# Patient Record
Sex: Female | Born: 1985
Health system: Southern US, Community
[De-identification: ages and names within clinical notes are randomized; demographics above are authoritative.]

## PROBLEM LIST (undated history)

## (undated) DIAGNOSIS — K603 Anal fistula, unspecified: Secondary | ICD-10-CM

## (undated) DIAGNOSIS — E669 Obesity, unspecified: Secondary | ICD-10-CM

## (undated) HISTORY — DX: Obesity, unspecified: E66.9

## (undated) HISTORY — PX: WISDOM TOOTH EXTRACTION: SHX21

---

## 2013-05-02 NOTE — L&D Delivery Note (Signed)
Delivery Note At 6:26 PM a viable female was delivered via Vaginal, Spontaneous Delivery (Presentation: ; Occiput Anterior).  APGAR: 9, 9; weight  pending.   Placenta status: Intact, Spontaneous.  Cord: 3 vessels with the following complications: None.  Cord pH: not obtained  Anesthesia: Epidural  Episiotomy: Left Mediolateral Lacerations: none Suture Repair: 3.0 chromic Est. Blood Loss (mL): 500  Mom to postpartum.  Baby to Couplet care / Skin to Skin.  Xylon Croom L 02/15/2014, 7:09 PM

## 2013-09-10 LAB — OB RESULTS CONSOLE RUBELLA ANTIBODY, IGM: RUBELLA: IMMUNE

## 2013-09-10 LAB — OB RESULTS CONSOLE HEPATITIS B SURFACE ANTIGEN: Hepatitis B Surface Ag: NEGATIVE

## 2013-09-10 LAB — OB RESULTS CONSOLE GC/CHLAMYDIA
Chlamydia: NEGATIVE
Gonorrhea: NEGATIVE

## 2013-09-10 LAB — OB RESULTS CONSOLE ANTIBODY SCREEN: Antibody Screen: NEGATIVE

## 2013-09-10 LAB — OB RESULTS CONSOLE RPR: RPR: NONREACTIVE

## 2013-09-10 LAB — OB RESULTS CONSOLE HIV ANTIBODY (ROUTINE TESTING): HIV: NONREACTIVE

## 2013-09-10 LAB — OB RESULTS CONSOLE ABO/RH: RH TYPE: NEGATIVE

## 2014-01-16 LAB — OB RESULTS CONSOLE GBS: GBS: POSITIVE

## 2014-02-15 ENCOUNTER — Encounter (HOSPITAL_COMMUNITY): Payer: 59 | Admitting: Anesthesiology

## 2014-02-15 ENCOUNTER — Inpatient Hospital Stay (HOSPITAL_COMMUNITY)
Admission: AD | Admit: 2014-02-15 | Discharge: 2014-02-17 | DRG: 775 | Disposition: A | Discharge status: Home / Self Care | Payer: 59 | Source: Ambulatory Visit | Attending: Obstetrics and Gynecology | Admitting: Obstetrics and Gynecology

## 2014-02-15 ENCOUNTER — Encounter (HOSPITAL_COMMUNITY): Payer: Self-pay

## 2014-02-15 ENCOUNTER — Inpatient Hospital Stay (HOSPITAL_COMMUNITY): Payer: 59 | Admitting: Anesthesiology

## 2014-02-15 DIAGNOSIS — Z3A40 40 weeks gestation of pregnancy: Secondary | ICD-10-CM | POA: Diagnosis present

## 2014-02-15 DIAGNOSIS — O480 Post-term pregnancy: Principal | ICD-10-CM | POA: Diagnosis present

## 2014-02-15 DIAGNOSIS — IMO0001 Reserved for inherently not codable concepts without codable children: Secondary | ICD-10-CM

## 2014-02-15 DIAGNOSIS — O99824 Streptococcus B carrier state complicating childbirth: Secondary | ICD-10-CM | POA: Diagnosis present

## 2014-02-15 LAB — CBC
HCT: 34.2 % — ABNORMAL LOW (ref 36.0–46.0)
HEMOGLOBIN: 12 g/dL (ref 12.0–15.0)
MCH: 30.8 pg (ref 26.0–34.0)
MCHC: 35.1 g/dL (ref 30.0–36.0)
MCV: 87.9 fL (ref 78.0–100.0)
PLATELETS: 136 10*3/uL — AB (ref 150–400)
RBC: 3.89 MIL/uL (ref 3.87–5.11)
RDW: 13.9 % (ref 11.5–15.5)
WBC: 12.2 10*3/uL — AB (ref 4.0–10.5)

## 2014-02-15 LAB — RPR

## 2014-02-15 LAB — URINE MICROSCOPIC-ADD ON

## 2014-02-15 LAB — URINALYSIS, ROUTINE W REFLEX MICROSCOPIC
Bilirubin Urine: NEGATIVE
GLUCOSE, UA: NEGATIVE mg/dL
KETONES UR: NEGATIVE mg/dL
Leukocytes, UA: NEGATIVE
Nitrite: NEGATIVE
PROTEIN: NEGATIVE mg/dL
Specific Gravity, Urine: 1.015 (ref 1.005–1.030)
Urobilinogen, UA: 0.2 mg/dL (ref 0.0–1.0)
pH: 7 (ref 5.0–8.0)

## 2014-02-15 MED ORDER — ONDANSETRON HCL 4 MG/2ML IJ SOLN
4.0000 mg | Freq: Four times a day (QID) | INTRAMUSCULAR | Status: DC | PRN
  Administered 2014-02-15: 4 mg via INTRAVENOUS
  Filled 2014-02-15: qty 2

## 2014-02-15 MED ORDER — ZOLPIDEM TARTRATE 5 MG PO TABS: 5.0000 mg | ORAL_TABLET | Freq: Every evening | ORAL | Status: DC | PRN

## 2014-02-15 MED ORDER — LACTATED RINGERS IV SOLN: 500.0000 mL | Freq: Once | INTRAVENOUS | Status: DC

## 2014-02-15 MED ORDER — OXYCODONE-ACETAMINOPHEN 5-325 MG PO TABS
1.0000 | ORAL_TABLET | ORAL | Status: DC | PRN
Start: 2014-02-15 — End: 2014-02-17

## 2014-02-15 MED ORDER — OXYTOCIN 40 UNITS IN LACTATED RINGERS INFUSION - SIMPLE MED: 62.5000 mL/h | INTRAVENOUS | Status: DC

## 2014-02-15 MED ORDER — LIDOCAINE HCL (PF) 1 % IJ SOLN
INTRAMUSCULAR | Status: AC
  Administered 2014-02-15: 30 mL
  Filled 2014-02-15: qty 30

## 2014-02-15 MED ORDER — LACTATED RINGERS IV SOLN: 500.0000 mL | INTRAVENOUS | Status: DC | PRN

## 2014-02-15 MED ORDER — LIDOCAINE HCL (PF) 1 % IJ SOLN
30.0000 mL | INTRAMUSCULAR | Status: AC | PRN
Start: 2014-02-15 — End: 2014-02-15
  Administered 2014-02-15 (×2): 5 mL via SUBCUTANEOUS

## 2014-02-15 MED ORDER — ACETAMINOPHEN 325 MG PO TABS: 650.0000 mg | ORAL_TABLET | ORAL | Status: DC | PRN

## 2014-02-15 MED ORDER — IBUPROFEN 600 MG PO TABS
600.0000 mg | ORAL_TABLET | Freq: Four times a day (QID) | ORAL | Status: DC
  Administered 2014-02-15 – 2014-02-17 (×7): 600 mg via ORAL
  Filled 2014-02-15 (×7): qty 1

## 2014-02-15 MED ORDER — CITRIC ACID-SODIUM CITRATE 334-500 MG/5ML PO SOLN: 30.0000 mL | ORAL | Status: DC | PRN

## 2014-02-15 MED ORDER — EPHEDRINE 5 MG/ML INJ
10.0000 mg | INTRAVENOUS | Status: DC | PRN
  Filled 2014-02-15: qty 2

## 2014-02-15 MED ORDER — TETANUS-DIPHTH-ACELL PERTUSSIS 5-2.5-18.5 LF-MCG/0.5 IM SUSP: 0.5000 mL | Freq: Once | INTRAMUSCULAR | Status: DC

## 2014-02-15 MED ORDER — SENNOSIDES-DOCUSATE SODIUM 8.6-50 MG PO TABS
2.0000 | ORAL_TABLET | ORAL | Status: DC
  Administered 2014-02-15 – 2014-02-16 (×2): 2 via ORAL
  Filled 2014-02-15 (×2): qty 2

## 2014-02-15 MED ORDER — OXYCODONE-ACETAMINOPHEN 5-325 MG PO TABS: 1.0000 | ORAL_TABLET | ORAL | Status: DC | PRN

## 2014-02-15 MED ORDER — DIPHENHYDRAMINE HCL 50 MG/ML IJ SOLN: 12.5000 mg | INTRAMUSCULAR | Status: DC | PRN

## 2014-02-15 MED ORDER — PHENYLEPHRINE 40 MCG/ML (10ML) SYRINGE FOR IV PUSH (FOR BLOOD PRESSURE SUPPORT)
80.0000 ug | PREFILLED_SYRINGE | INTRAVENOUS | Status: DC | PRN
  Filled 2014-02-15: qty 2

## 2014-02-15 MED ORDER — WITCH HAZEL-GLYCERIN EX PADS: 1.0000 "application " | MEDICATED_PAD | CUTANEOUS | Status: DC | PRN

## 2014-02-15 MED ORDER — TERBUTALINE SULFATE 1 MG/ML IJ SOLN: 0.2500 mg | Freq: Once | INTRAMUSCULAR | Status: DC | PRN

## 2014-02-15 MED ORDER — OXYCODONE-ACETAMINOPHEN 5-325 MG PO TABS: 2.0000 | ORAL_TABLET | ORAL | Status: DC | PRN

## 2014-02-15 MED ORDER — PHENYLEPHRINE 40 MCG/ML (10ML) SYRINGE FOR IV PUSH (FOR BLOOD PRESSURE SUPPORT)
80.0000 ug | PREFILLED_SYRINGE | INTRAVENOUS | Status: DC | PRN
  Filled 2014-02-15: qty 2
  Filled 2014-02-15: qty 10

## 2014-02-15 MED ORDER — ONDANSETRON HCL 4 MG/2ML IJ SOLN: 4.0000 mg | INTRAMUSCULAR | Status: DC | PRN

## 2014-02-15 MED ORDER — PENICILLIN G POTASSIUM 5000000 UNITS IJ SOLR
2.5000 10*6.[IU] | INTRAVENOUS | Status: DC
  Administered 2014-02-15: 2.5 10*6.[IU] via INTRAVENOUS
  Filled 2014-02-15 (×4): qty 2.5

## 2014-02-15 MED ORDER — DIPHENHYDRAMINE HCL 25 MG PO CAPS: 25.0000 mg | ORAL_CAPSULE | Freq: Four times a day (QID) | ORAL | Status: DC | PRN

## 2014-02-15 MED ORDER — SIMETHICONE 80 MG PO CHEW: 80.0000 mg | CHEWABLE_TABLET | ORAL | Status: DC | PRN

## 2014-02-15 MED ORDER — PRENATAL MULTIVITAMIN CH
1.0000 | ORAL_TABLET | Freq: Every day | ORAL | Status: DC
Start: 2014-02-16 — End: 2014-02-17
  Administered 2014-02-16 – 2014-02-17 (×2): 1 via ORAL
  Filled 2014-02-15 (×2): qty 1

## 2014-02-15 MED ORDER — FLEET ENEMA 7-19 GM/118ML RE ENEM: 1.0000 | ENEMA | Freq: Every day | RECTAL | Status: DC | PRN

## 2014-02-15 MED ORDER — LANOLIN HYDROUS EX OINT: TOPICAL_OINTMENT | CUTANEOUS | Status: DC | PRN

## 2014-02-15 MED ORDER — OXYTOCIN BOLUS FROM INFUSION: 500.0000 mL | INTRAVENOUS | Status: DC

## 2014-02-15 MED ORDER — PENICILLIN G POTASSIUM 5000000 UNITS IJ SOLR
5.0000 10*6.[IU] | Freq: Once | INTRAVENOUS | Status: AC
  Administered 2014-02-15: 5 10*6.[IU] via INTRAVENOUS
  Filled 2014-02-15: qty 5

## 2014-02-15 MED ORDER — ONDANSETRON HCL 4 MG PO TABS: 4.0000 mg | ORAL_TABLET | ORAL | Status: DC | PRN

## 2014-02-15 MED ORDER — BISACODYL 10 MG RE SUPP: 10.0000 mg | Freq: Every day | RECTAL | Status: DC | PRN

## 2014-02-15 MED ORDER — FENTANYL 2.5 MCG/ML BUPIVACAINE 1/10 % EPIDURAL INFUSION (WH - ANES)
14.0000 mL/h | INTRAMUSCULAR | Status: DC | PRN
  Administered 2014-02-15: 14 mL/h via EPIDURAL
  Filled 2014-02-15: qty 125

## 2014-02-15 MED ORDER — BENZOCAINE-MENTHOL 20-0.5 % EX AERO
1.0000 "application " | INHALATION_SPRAY | CUTANEOUS | Status: DC | PRN
  Administered 2014-02-17: 1 via TOPICAL
  Filled 2014-02-15 (×2): qty 56

## 2014-02-15 MED ORDER — OXYTOCIN 40 UNITS IN LACTATED RINGERS INFUSION - SIMPLE MED
1.0000 m[IU]/min | INTRAVENOUS | Status: DC
  Administered 2014-02-15: 2 m[IU]/min via INTRAVENOUS
  Filled 2014-02-15: qty 1000

## 2014-02-15 MED ORDER — MEDROXYPROGESTERONE ACETATE 150 MG/ML IM SUSP: 150.0000 mg | INTRAMUSCULAR | Status: DC | PRN

## 2014-02-15 MED ORDER — DIBUCAINE 1 % RE OINT: 1.0000 "application " | TOPICAL_OINTMENT | RECTAL | Status: DC | PRN

## 2014-02-15 MED ORDER — LACTATED RINGERS IV SOLN
INTRAVENOUS | Status: DC
Start: 2014-02-15 — End: 2014-02-15
  Administered 2014-02-15: 13:00:00 via INTRAVENOUS

## 2014-02-15 MED ORDER — MEASLES, MUMPS & RUBELLA VAC ~~LOC~~ INJ
0.5000 mL | INJECTION | Freq: Once | SUBCUTANEOUS | Status: DC
  Filled 2014-02-15: qty 0.5

## 2014-02-15 NOTE — MAU Note (Signed)
Dr Vincente PoliGrewal notified of pt's VE, orders received to ambulate x 1 more hour and recheck cervix

## 2014-02-15 NOTE — Anesthesia Procedure Notes (Signed)
Epidural Patient location during procedure: OB Start time: 02/15/2014 12:27 PM End time: 02/15/2014 12:41 PM  Staffing Anesthesiologist: Theador Jezewski, CHRIS Performed by: anesthesiologist   Preanesthetic Checklist Completed: patient identified, surgical consent, pre-op evaluation, timeout performed, IV checked, risks and benefits discussed and monitors and equipment checked  Epidural Patient position: sitting Prep: site prepped and draped and DuraPrep Patient monitoring: heart rate, cardiac monitor, continuous pulse ox and blood pressure Approach: midline Location: L4-L5 Injection technique: LOR saline  Needle:  Needle type: Tuohy  Needle gauge: 17 G Needle length: 9 cm Needle insertion depth: 7 cm Catheter type: closed end flexible Catheter size: 19 Gauge Catheter at skin depth: 12 cm Test dose: Other and negative  Assessment Events: blood not aspirated, injection not painful, no injection resistance, negative IV test and no paresthesia  Additional Notes H+P and labs checked, risks and benefits discussed with the patient, consent obtained, procedure tolerated well and without complications.  Reason for block:procedure for pain

## 2014-02-15 NOTE — MAU Note (Signed)
Pt states u/c's q4 minutes apart. Denies gush of fluid. Does have bloody show.

## 2014-02-15 NOTE — Anesthesia Preprocedure Evaluation (Signed)
Anesthesia Evaluation  Patient identified by MRN, date of birth, ID band Patient awake    Reviewed: Allergy & Precautions, H&P , NPO status , Patient's Chart, lab work & pertinent test results  History of Anesthesia Complications Negative for: history of anesthetic complications  Airway Mallampati: II TM Distance: >3 FB Neck ROM: Full    Dental  (+) Teeth Intact   Pulmonary neg pulmonary ROS,          Cardiovascular negative cardio ROS  Rhythm:Regular     Neuro/Psych negative neurological ROS  negative psych ROS   GI/Hepatic negative GI ROS,   Endo/Other  negative endocrine ROS  Renal/GU negative Renal ROS     Musculoskeletal   Abdominal   Peds  Hematology  (+) anemia ,   Anesthesia Other Findings   Reproductive/Obstetrics (+) Pregnancy                           Anesthesia Physical Anesthesia Plan  ASA: II  Anesthesia Plan: Epidural   Post-op Pain Management:    Induction:   Airway Management Planned:   Additional Equipment: None  Intra-op Plan:   Post-operative Plan:   Informed Consent: I have reviewed the patients History and Physical, chart, labs and discussed the procedure including the risks, benefits and alternatives for the proposed anesthesia with the patient or authorized representative who has indicated his/her understanding and acceptance.   Dental advisory given  Plan Discussed with: Anesthesiologist  Anesthesia Plan Comments:         Anesthesia Quick Evaluation

## 2014-02-15 NOTE — Progress Notes (Signed)
Dr Vincente PoliGrewal notified of pt's VE orders receive to admit pt

## 2014-02-15 NOTE — H&P (Signed)
Alyssa Cordova is a 28 y.o. G 1 P 0 at 40 w 3 days presents in early labor. Now status post epidural and feeling much better. Maternal Medical History:  Reason for admission: Contractions.   Contractions: Onset was 3-5 hours ago.    Fetal activity: Perceived fetal activity is normal.      OB History   Grav Para Term Preterm Abortions TAB SAB Ect Mult Living   1              Past Medical History  Diagnosis Date  . Medical history non-contributory    Past Surgical History  Procedure Laterality Date  . No past surgeries     Family History: family history includes Asthma in her mother; COPD in her maternal grandmother; Diabetes in her mother; Heart disease in her maternal grandfather and mother; Hypertension in her mother; Kidney disease in her mother. Social History:  reports that she has never smoked. She has never used smokeless tobacco. She reports that she does not drink alcohol or use illicit drugs.   Prenatal Transfer Tool  Maternal Diabetes: No Genetic Screening: Normal Maternal Ultrasounds/Referrals: Normal Fetal Ultrasounds or other Referrals:  None Maternal Substance Abuse:  No Significant Maternal Medications:  None Significant Maternal Lab Results:  None Other Comments:  None  Review of Systems  All other systems reviewed and are negative.   Dilation: 3.5 Effacement (%): 100 Station: -1 Exam by:: dr Vincente Poligrewal Blood pressure 123/80, pulse 70, temperature 97.3 F (36.3 C), temperature source Oral, resp. rate 20, height 5\' 11"  (1.803 m), weight 106.822 kg (235 lb 8 oz), SpO2 100.00%. Maternal Exam:  Uterine Assessment: Contraction strength is moderate.  Contraction frequency is irregular.   Abdomen: Fetal presentation: vertex     Fetal Exam Fetal State Assessment: Category I - tracings are normal.     Physical Exam  Nursing note and vitals reviewed. Constitutional: She appears well-developed and well-nourished.  HENT:  Head: Normocephalic.  Neck:  Normal range of motion.  Cardiovascular: Normal rate and regular rhythm.   Respiratory: Effort normal.  GI: Soft.    Prenatal labs: ABO, Rh: --/--/A NEG (10/17 1115) Antibody: POS (10/17 1115) Rubella: Immune (05/12 0000) RPR: Nonreactive (05/12 0000)  HBsAg: Negative (05/12 0000)  HIV: Non-reactive (05/12 0000)  GBS: Positive (09/17 0000)   Assessment/Plan: IUP at 40 w 3 days Labor GBBS + Status post epidural Antibiotics started for GBBS + Recommend pitocin for labor augmentation - risks reviewed Anticipate NSVD  Alyssa Cordova L 02/15/2014, 1:51 PM

## 2014-02-16 LAB — CBC
HCT: 28.7 % — ABNORMAL LOW (ref 36.0–46.0)
Hemoglobin: 10 g/dL — ABNORMAL LOW (ref 12.0–15.0)
MCH: 30.8 pg (ref 26.0–34.0)
MCHC: 34.8 g/dL (ref 30.0–36.0)
MCV: 88.3 fL (ref 78.0–100.0)
Platelets: 125 10*3/uL — ABNORMAL LOW (ref 150–400)
RBC: 3.25 MIL/uL — AB (ref 3.87–5.11)
RDW: 14 % (ref 11.5–15.5)
WBC: 15.7 10*3/uL — AB (ref 4.0–10.5)

## 2014-02-16 MED ORDER — RHO D IMMUNE GLOBULIN 1500 UNIT/2ML IJ SOSY
300.0000 ug | PREFILLED_SYRINGE | Freq: Once | INTRAMUSCULAR | Status: AC
  Administered 2014-02-16: 300 ug via INTRAVENOUS
  Filled 2014-02-16: qty 2

## 2014-02-16 NOTE — Plan of Care (Signed)
Problem: Phase I Progression Outcomes Goal: Voiding adequately Outcome: Completed/Met Date Met:  02/16/14 Pt is voiding well. Goal: OOB as tolerated unless otherwise ordered Outcome: Completed/Met Date Met:  02/16/14 Pt is up ambulating in room independently.  Problem: Phase II Progression Outcomes Goal: Pain controlled on oral analgesia Pain is well controlled well with motrin.  Goal: Tolerating diet Outcome: Completed/Met Date Met:  02/16/14 Pt is tolerating a regular diet well.

## 2014-02-16 NOTE — Anesthesia Postprocedure Evaluation (Signed)
Anesthesia Post Note  Patient: Alyssa Cordova  Procedure(s) Performed: * No procedures listed *  Anesthesia type: Epidural  Patient location: Mother/Baby  Post pain: Pain level controlled  Post assessment: Post-op Vital signs reviewed  Last Vitals:  Filed Vitals:   02/16/14 0500  BP: 100/61  Pulse: 75  Temp: 36.6 C  Resp: 18    Post vital signs: Reviewed  Level of consciousness:alert  Complications: No apparent anesthesia complications

## 2014-02-16 NOTE — Progress Notes (Signed)
Post Partum Day 1 Subjective: no complaints and up ad lib  Objective: Blood pressure 100/61, pulse 75, temperature 97.8 F (36.6 C), temperature source Oral, resp. rate 18, height 5\' 11"  (1.803 m), weight 106.822 kg (235 lb 8 oz), SpO2 100.00%, unknown if currently breastfeeding.  Physical Exam:  General: alert, cooperative and appears stated age Lochia: appropriate Uterine Fundus: firm Incision: healing well, no significant drainage, no dehiscence DVT Evaluation: No evidence of DVT seen on physical exam.   Recent Labs  02/15/14 1115 02/16/14 0550  HGB 12.0 10.0*  HCT 34.2* 28.7*    Assessment/Plan: Plan for discharge tomorrow   LOS: 1 day   Pedro Oldenburg L 02/16/2014, 8:00 AM

## 2014-02-16 NOTE — Lactation Note (Signed)
This note was copied from the chart of Alyssa Jannifer RodneyChristy Fang. Lactation Consultation Note  Patient Name: Alyssa Cordova [id number]'BToday's Date: 02/16/2014 Reason for consult: Initial assessment Mom reports some mild nipple tenderness with initial latch. No breakdown noted. LC assisted Mom with positioning and obtaining depth with latch. Mom reports less discomfort. BF basics reviewed, cluster feeding discussed. Lactation brochure left for review, advised of OP services and support group. Answered Mom's questions regarding purchase of pump. Advised to apply EBM to sore nipples. Encouraged to call for questions/concerns or help with latch if tenderness continues.   Maternal Data Has patient been taught Hand Expression?: Yes Does the patient have breastfeeding experience prior to this delivery?: No  Feeding Feeding Type: Breast Fed Length of feed: 25 min  LATCH Score/Interventions Latch: Grasps breast easily, tongue down, lips flanged, rhythmical sucking. Intervention(s): Adjust position;Assist with latch;Breast massage;Breast compression  Audible Swallowing: A few with stimulation  Type of Nipple: Everted at rest and after stimulation  Comfort (Breast/Nipple): Filling, red/small blisters or bruises, mild/mod discomfort  Problem noted: Mild/Moderate discomfort Interventions (Mild/moderate discomfort): Hand massage;Hand expression  Hold (Positioning): Assistance needed to correctly position infant at breast and maintain latch. Intervention(s): Breastfeeding basics reviewed;Support Pillows;Position options;Skin to skin  LATCH Score: 7  Lactation Tools Discussed/Used     Consult Status Consult Status: Follow-up Date: 02/17/14 Follow-up type: In-patient    Alfred LevinsGranger, Mattisen Pohlmann Ann 02/16/2014, 9:10 PM

## 2014-02-17 LAB — RH IG WORKUP (INCLUDES ABO/RH)
ABO/RH(D): A NEG
FETAL SCREEN: NEGATIVE
Gestational Age(Wks): 40.3
Unit division: 0

## 2014-02-17 LAB — CBC
HEMATOCRIT: 28.9 % — AB (ref 36.0–46.0)
Hemoglobin: 9.8 g/dL — ABNORMAL LOW (ref 12.0–15.0)
MCH: 30.3 pg (ref 26.0–34.0)
MCHC: 33.9 g/dL (ref 30.0–36.0)
MCV: 89.5 fL (ref 78.0–100.0)
Platelets: 121 10*3/uL — ABNORMAL LOW (ref 150–400)
RBC: 3.23 MIL/uL — AB (ref 3.87–5.11)
RDW: 14.4 % (ref 11.5–15.5)
WBC: 14.5 10*3/uL — ABNORMAL HIGH (ref 4.0–10.5)

## 2014-02-17 MED ORDER — IBUPROFEN 600 MG PO TABS: 600.0000 mg | ORAL_TABLET | Freq: Four times a day (QID) | ORAL | Status: DC

## 2014-02-17 NOTE — Lactation Note (Addendum)
This note was copied from the chart of Alyssa Alyssa Cordova. Lactation Consultation Note  Patient Name: Alyssa Cordova ZOXWR'UToday's Date: 02/17/2014 Reason for consult: Follow-up assessment Per mom breast feeding is going well , baby cluster fed all night and I'm exhausted. LC reviewed basics for latching , sore nipple and engorgement prevention and tx. Per mom plans [facility] for the healthy [facility] program. Per mom nipples are both tender , using EBM and comfort gels. LC assessed breast tissue with moms permission. LC noted both nipples to be pinky red , right noted a crack at the base of the nipples around 11 -12 o'clock. On the left reddened at the base . Reviewed and had mom re demo hand expressing and steady flow of colostrum noted. Baby woke up , hungry , worked with mom and showed dad how he could help with depth at the breast. Baby fed for 15 mins  With multiply swallows, increased with breast compressions. Per mom comfortable. When baby released nipple appeared normal. Baby asleep. Sore nipple tx stressed. Mother informed of post-discharge support and given phone number to the lactation department,  including services for phone call assistance; out-patient appointments; and breastfeeding support group. List of other breastfeeding  resources in the community given in the handout. Encouraged mother to call for problems or concerns related to breastfeeding.      Maternal Data Has patient been taught Hand Expression?: Yes  Feeding Feeding Type: Breast Fed Length of feed: 15 min  LATCH Score/Interventions Latch: Grasps breast easily, tongue down, lips flanged, rhythmical sucking. Intervention(s): Adjust position;Assist with latch;Breast massage;Breast compression  Audible Swallowing: Spontaneous and intermittent Intervention(s): Skin to skin;Hand expression  Type of Nipple: Everted at rest and after stimulation  Comfort (Breast/Nipple): Filling,  red/small blisters or bruises, mild/mod discomfort  Problem noted: Filling Interventions (Mild/moderate discomfort): Comfort gels (encouraged expression of own colostrum)  Hold (Positioning): Assistance needed to correctly position infant at breast and maintain latch. (worked on depth ) Intervention(s): Breastfeeding basics reviewed;Support Pillows;Position options;Skin to skin  LATCH Score: 8  Lactation Tools Discussed/Used WIC Program: No Pump Review: Setup, frequency, and cleaning;Milk Storage Initiated by:: MAI  Date initiated:: 02/17/14   Consult Status Consult Status: Complete Date: 02/17/14 Follow-up type: In-patient    Kathrin Greathouseorio, Simi Briel Ann 02/17/2014, 10:50 AM

## 2014-02-17 NOTE — Discharge Summary (Signed)
Obstetric Discharge Summary Reason for Admission: onset of labor Prenatal Procedures: ultrasound Intrapartum Procedures: spontaneous vaginal delivery Postpartum Procedures: none Complications-Operative and Postpartum: LML episiotomy Hemoglobin  Date Value Ref Range Status  02/17/2014 9.8* 12.0 - 15.0 g/dL Final     HCT  Date Value Ref Range Status  02/17/2014 28.9* 36.0 - 46.0 % Final    Physical Exam:  General: alert and cooperative Lochia: appropriate Uterine Fundus: firm Incision: healing well DVT Evaluation: No evidence of DVT seen on physical exam. Negative Homan's sign. No cords or calf tenderness. No significant calf/ankle edema.  Discharge Diagnoses: Term Pregnancy-delivered  Discharge Information: Date: 02/17/2014 Activity: pelvic rest Diet: routine Medications: PNV and Ibuprofen Condition: stable Instructions: refer to practice specific booklet Discharge to: home   Newborn Data: Live born female  Birth Weight: 7 lb 9.7 oz (3450 g) APGAR: 9, 9  Home with mother.  Alyssa Cordova G 02/17/2014, 8:25 AM

## 2014-02-19 LAB — TYPE AND SCREEN
ABO/RH(D): A NEG
ANTIBODY SCREEN: POSITIVE
DAT, IgG: NEGATIVE
UNIT DIVISION: 0
Unit division: 0

## 2014-03-03 ENCOUNTER — Encounter (HOSPITAL_COMMUNITY): Payer: Self-pay

## 2014-11-11 ENCOUNTER — Other Ambulatory Visit: Payer: 59

## 2014-11-11 ENCOUNTER — Other Ambulatory Visit: Payer: Self-pay | Admitting: Physician Assistant

## 2014-11-11 ENCOUNTER — Other Ambulatory Visit: Payer: Self-pay | Admitting: Family

## 2014-11-11 DIAGNOSIS — R21 Rash and other nonspecific skin eruption: Secondary | ICD-10-CM

## 2014-11-11 DIAGNOSIS — L301 Dyshidrosis [pompholyx]: Secondary | ICD-10-CM

## 2014-11-11 MED ORDER — CLOBETASOL PROPIONATE 0.05 % EX OINT: 1.0000 "application " | TOPICAL_OINTMENT | Freq: Two times a day (BID) | CUTANEOUS | Status: DC

## 2014-11-17 LAB — ANAEROBIC AND AEROBIC CULTURE

## 2014-11-19 ENCOUNTER — Other Ambulatory Visit: Payer: Self-pay | Admitting: Physician Assistant

## 2014-11-20 ENCOUNTER — Other Ambulatory Visit: Payer: Self-pay | Admitting: Physician Assistant

## 2014-11-20 DIAGNOSIS — B958 Unspecified staphylococcus as the cause of diseases classified elsewhere: Secondary | ICD-10-CM

## 2014-11-20 DIAGNOSIS — L089 Local infection of the skin and subcutaneous tissue, unspecified: Principal | ICD-10-CM

## 2014-11-20 MED ORDER — MUPIROCIN 2 % EX OINT: TOPICAL_OINTMENT | CUTANEOUS | Status: DC

## 2014-12-04 ENCOUNTER — Other Ambulatory Visit: Payer: Self-pay | Admitting: Nurse Practitioner

## 2014-12-04 MED ORDER — CEPHALEXIN 500 MG PO CAPS: 500.0000 mg | ORAL_CAPSULE | Freq: Three times a day (TID) | ORAL | Status: DC

## 2014-12-16 ENCOUNTER — Telehealth: Payer: 59 | Admitting: Nurse Practitioner

## 2014-12-16 DIAGNOSIS — N760 Acute vaginitis: Secondary | ICD-10-CM

## 2014-12-16 MED ORDER — FLUCONAZOLE 150 MG PO TABS: ORAL_TABLET | ORAL | Status: DC

## 2014-12-16 NOTE — Progress Notes (Signed)
We are sorry that you are not feeling well. Here is how we plan to help! Based on what you shared with me it looks like you: May have a yeast vaginosis  Vaginosis is an inflammation of the vagina that can result in discharge, itching and pain. The cause is usually a change in the normal balance of vaginal bacteria or an infection. Vaginosis can also result from reduced estrogen levels after menopause.  The most common causes of vaginosis are:   Bacterial vaginosis which results from an overgrowth of one on several organisms that are normally present in your vagina.   Yeast infections which are caused by a naturally occurring fungus called candida.   Vaginal atrophy (atrophic vaginosis) which results from the thinning of the vagina from reduced estrogen levels after menopause.   Trichomoniasis which is caused by a parasite and is commonly transmitted by sexual intercourse.  Factors that increase your risk of developing vaginosis include: . Medications, such as antibiotics and steroids . Uncontrolled diabetes . Use of hygiene products such as bubble bath, vaginal spray or vaginal deodorant . Douching . Wearing damp or tight-fitting clothing . Using an intrauterine device (IUD) for birth control . Hormonal changes, such as those associated with pregnancy, birth control pills or menopause . Sexual activity . Having a sexually transmitted infection  Your treatment plan is A single Diflucan (fluconazole) 150mg tablet once.  I have electronically sent this prescription into the pharmacy that you have chosen.  Be sure to take all of the medication as directed. Stop taking any medication if you develop a rash, tongue swelling or shortness of breath. Mothers who are breast feeding should consider pumping and discarding their breast milk while on these antibiotics. However, there is no consensus that infant exposure at these doses would be harmful.  Remember that medication creams can weaken latex  condoms. .   HOME CARE:  Good hygiene may prevent some types of vaginosis from recurring and may relieve some symptoms:  . Avoid baths, hot tubs and whirlpool spas. Rinse soap from your outer genital area after a shower, and dry the area well to prevent irritation. Don't use scented or harsh soaps, such as those with deodorant or antibacterial action. . Avoid irritants. These include scented tampons and pads. . Wipe from front to back after using the toilet. Doing so avoids spreading fecal bacteria to your vagina.  Other things that may help prevent vaginosis include:  . Don't douche. Your vagina doesn't require cleansing other than normal bathing. Repetitive douching disrupts the normal organisms that reside in the vagina and can actually increase your risk of vaginal infection. Douching won't clear up a vaginal infection. . Use a latex condom. Both female and female latex condoms may help you avoid infections spread by sexual contact. . Wear cotton underwear. Also wear pantyhose with a cotton crotch. If you feel comfortable without it, skip wearing underwear to bed. Yeast thrives in moist environments Your symptoms should improve in the next day or two.  GET HELP RIGHT AWAY IF:  . You have pain in your lower abdomen ( pelvic area or over your ovaries) . You develop nausea or vomiting . You develop a fever . Your discharge changes or worsens . You have persistent pain with intercourse . You develop shortness of breath, a rapid pulse, or you faint.  These symptoms could be signs of problems or infections that need to be evaluated by a medical provider now.  MAKE SURE YOU      Understand these instructions.  Will watch your condition.  Will get help right away if you are not doing well or get worse.   Your e-visit answers were reviewed by a board certified advanced clinical practitioner to complete your personal care plan. Depending upon the condition, your plan could have included  both over the counter or prescription medications. Please review your pharmacy choice to make sure that you have choses a pharmacy that is open for you to pick up any needed prescription, Your safety is important to us. If you have drug allergies check your prescription carefully.  You can use MyChart to ask questions about today's visit, request a non-urgent call back, or ask for a work or school excuse. You will get a MyChart message within the next two days asking about your experience. I hope that your e-visit has been valuable and will speed your recovery.  

## 2014-12-18 ENCOUNTER — Other Ambulatory Visit: Payer: Self-pay | Admitting: Nurse Practitioner

## 2014-12-18 MED ORDER — SULFAMETHOXAZOLE-TRIMETHOPRIM 800-160 MG PO TABS: 1.0000 | ORAL_TABLET | Freq: Two times a day (BID) | ORAL | Status: DC

## 2015-01-18 ENCOUNTER — Other Ambulatory Visit: Payer: Self-pay | Admitting: Physician Assistant

## 2015-01-18 DIAGNOSIS — B958 Unspecified staphylococcus as the cause of diseases classified elsewhere: Secondary | ICD-10-CM

## 2015-01-18 DIAGNOSIS — L089 Local infection of the skin and subcutaneous tissue, unspecified: Principal | ICD-10-CM

## 2015-01-18 MED ORDER — SULFAMETHOXAZOLE-TRIMETHOPRIM 800-160 MG PO TABS: 1.0000 | ORAL_TABLET | Freq: Two times a day (BID) | ORAL | Status: DC

## 2015-01-18 MED ORDER — MUPIROCIN 2 % EX OINT: TOPICAL_OINTMENT | CUTANEOUS | Status: DC

## 2015-04-14 ENCOUNTER — Other Ambulatory Visit: Payer: Self-pay | Admitting: Nurse Practitioner

## 2015-04-14 MED ORDER — AMOXICILLIN 875 MG PO TABS: 875.0000 mg | ORAL_TABLET | Freq: Two times a day (BID) | ORAL | Status: DC

## 2015-05-08 ENCOUNTER — Other Ambulatory Visit: Payer: Self-pay | Admitting: Family

## 2015-05-08 ENCOUNTER — Other Ambulatory Visit: Payer: Self-pay | Admitting: Nurse Practitioner

## 2015-05-08 MED ORDER — NORETHINDRONE 0.35 MG PO TABS: 1.0000 | ORAL_TABLET | Freq: Every day | ORAL | Status: DC

## 2015-05-22 DIAGNOSIS — K603 Anal fistula: Secondary | ICD-10-CM | POA: Diagnosis not present

## 2015-05-25 DIAGNOSIS — R05 Cough: Secondary | ICD-10-CM | POA: Diagnosis not present

## 2015-07-08 ENCOUNTER — Ambulatory Visit: Payer: Self-pay | Admitting: General Surgery

## 2015-07-08 DIAGNOSIS — Z6829 Body mass index (BMI) 29.0-29.9, adult: Secondary | ICD-10-CM | POA: Diagnosis not present

## 2015-07-08 DIAGNOSIS — Z01419 Encounter for gynecological examination (general) (routine) without abnormal findings: Secondary | ICD-10-CM | POA: Diagnosis not present

## 2015-07-08 DIAGNOSIS — K603 Anal fistula: Secondary | ICD-10-CM | POA: Diagnosis not present

## 2015-07-08 NOTE — H&P (Signed)
Alyssa Cordova 07/08/2015 11:52 AM Location: [facility] Patient #: [id number] DOB: 1985-08-01 Married / Language: [place] / Race: White Female  History of Present Illness Alyssa Cordova(Romey Cohea J. Mertice Uffelman MD; 07/08/2015 12:18 PM) Patient words: fistula.  The patient is a 30 year old female.   Note:She is referred by Alyssa ClusterElizabeth Cordova, [facility] for consultation regarding a perianal fistula. 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 In August, she noticed a painful lump in the perianal area. She incised this sharply and had some purulent drainage. Since that time, she had been on 5 different courses of antibiotics. She continues to have persistent drainage from the area. There is a concern for an anal fistula and she's been sent here for further evaluation. No incontinence. She had a vaginal delivery a year and half ago and had an episiotomy with that.  Other Problems Alyssa Cordova(Alyssa Cordova, CMA; 07/08/2015 11:52 AM) No pertinent past medical history  Past Surgical History Alyssa Cordova([place]; 07/08/2015 11:52 AM) Oral Surgery  Diagnostic Studies History Alyssa Cordova([place]; 07/08/2015 11:52 AM) Colonoscopy never Mammogram never Pap Smear 1-5 years ago  Allergies Alyssa Cordova(Alyssa Cordova, CMA; 07/08/2015 11:53 AM) No Known Drug Allergies 07/08/2015  Medication History Alyssa Cordova(Alyssa Cordova, CMA; 07/08/2015 11:53 AM) Gaylord Shihrtho Micronor (0.35MG  Tablet, Oral daily) Active. Medications Reconciled  Social History Alyssa Cordova(Alyssa Cordova, CMA; 07/08/2015 11:52 AM) Caffeine use Carbonated beverages, Coffee, Tea. No alcohol use No drug use Tobacco use Never smoker.  Family History Alyssa Cordova([place]; 07/08/2015 11:52 AM) Diabetes Mellitus Mother. Heart Disease Mother. Kidney Disease Mother. Respiratory Condition Mother.  Pregnancy / Birth History Alyssa Cordova(Alyssa Cordova, CMA; 07/08/2015 11:52 AM) Age at menarche 12 years. Contraceptive History Oral contraceptives. Gravida 1 Maternal age 29-30 Para 1 Regular  periods     Review of Systems Alyssa Cordova(Alyssa Cordova CMA; 07/08/2015 11:52 AM) General Not Present- Appetite Loss, Chills, Fatigue, Fever, Night Sweats, Weight Gain and Weight Loss. Skin Present- New Lesions. Not Present- Change in Wart/Mole, Dryness, Hives, Jaundice, Non-Healing Wounds, Rash and Ulcer. HEENT Not Present- Earache, Hearing Loss, Hoarseness, Nose Bleed, Oral Ulcers, Ringing in the Ears, Seasonal Allergies, Sinus Pain, Sore Throat, Visual Disturbances, Wears glasses/contact lenses and Yellow Eyes. Respiratory Not Present- Bloody sputum, Chronic Cough, Difficulty Breathing, Snoring and Wheezing. Breast Not Present- Breast Mass, Breast Pain, Nipple Discharge and Skin Changes. Cardiovascular Not Present- Chest Pain, Difficulty Breathing Lying Down, Leg Cramps, Palpitations, Rapid Heart Rate, Shortness of Breath and Swelling of Extremities. Gastrointestinal Not Present- Abdominal Pain, Bloating, Bloody Stool, Change in Bowel Habits, Chronic diarrhea, Constipation, Difficulty Swallowing, Excessive gas, Gets full quickly at meals, Hemorrhoids, Indigestion, Nausea, Rectal Pain and Vomiting. Female Genitourinary Not Present- Frequency, Nocturia, Painful Urination, Pelvic Pain and Urgency. Musculoskeletal Not Present- Back Pain, Joint Pain, Joint Stiffness, Muscle Pain, Muscle Weakness and Swelling of Extremities. Neurological Not Present- Decreased Memory, Fainting, Headaches, Numbness, Seizures, Tingling, Tremor, Trouble walking and Weakness. Psychiatric Not Present- Anxiety, Bipolar, Change in Sleep Pattern, Depression, Fearful and Frequent crying. Endocrine Not Present- Cold Intolerance, Excessive Hunger, Hair Changes, Heat Intolerance, Hot flashes and New Diabetes. Hematology Not Present- Easy Bruising, Excessive bleeding, Gland problems, HIV and Persistent Infections.  Vitals Alyssa Cordova(Alyssa Cordova CMA; 07/08/2015 11:54 AM) 07/08/2015 11:53 AM Weight: 206.4 lb Height: 71in Body Surface Area: 2.14  m Body Mass Index: 28.79 kg/m  Temp.: 98.70F(Oral)  Pulse: 84 (Regular)  BP: 110/80 (Sitting, Left Arm, Standard)      Physical Exam Alyssa Cordova(Alyssa Bumbaugh J. Sharena Dibenedetto MD; 07/08/2015 12:22 PM)  The physical exam findings are as follows: Note:General: Slightly overweight female  in NAD. Pleasant and cooperative.  CV: RRR, no murmur, no JVD.  CHEST: Breath sounds equal and clear. Respirations nonlabored.  ANORECTAL: Punctate draining wound at 2:00 position of perianal area. On DRE, a tender irregular area is felt in the anterior anal cannula. A small lacrimal duct probe was used to cannulate the draining wound and this was able to track to an area where I could feel the tip of the probe in the anal canal on digital rectal exam consistent with fistula.  NEUROLOGIC: Alert and oriented, answers questions appropriately.  PSYCHIATRIC: Normal mood, affect , and behavior.    Assessment & Plan Alyssa Pollack MD; 07/08/2015 12:15 PM)  ANAL FISTULA (K60.3) Impression: This is tracking from the approximate 2 o'clock position to the anterior anal area.  Plan: I recommended EUA and repair of anal fistula by way of fistulotomy as long as her is not significant disruption of the anal sphincter muscle doing this. If so, then I would place a seton and come back at a later time to do the fistula. We discussed the procedure, rationale, and risks. Risks include but are not limited to bleeding, recurrent infection or fistula, incontinence, anesthesia, poor wound healing. She seems to understand this and agrees with the plan.  Avel Peace, MD

## 2015-07-27 LAB — HM PAP SMEAR

## 2015-08-20 ENCOUNTER — Encounter (HOSPITAL_BASED_OUTPATIENT_CLINIC_OR_DEPARTMENT_OTHER): Payer: Self-pay | Admitting: *Deleted

## 2015-08-20 NOTE — Progress Notes (Signed)
NPO AFTER MN.  ARRIVE AT 0600.  NEEDS URINE PREG.  [facility] [place] [facility] PT IS FNP.

## 2015-08-24 ENCOUNTER — Other Ambulatory Visit: Payer: Self-pay | Admitting: Family

## 2015-08-24 ENCOUNTER — Other Ambulatory Visit: Payer: 59

## 2015-08-24 DIAGNOSIS — Z01818 Encounter for other preprocedural examination: Secondary | ICD-10-CM | POA: Diagnosis not present

## 2015-08-25 LAB — CBC WITH DIFFERENTIAL/PLATELET
BASOS: 0 %
Basophils Absolute: 0 10*3/uL (ref 0.0–0.2)
EOS (ABSOLUTE): 0.3 10*3/uL (ref 0.0–0.4)
EOS: 2 %
HEMATOCRIT: 41.8 % (ref 34.0–46.6)
Hemoglobin: 13.7 g/dL (ref 11.1–15.9)
IMMATURE GRANS (ABS): 0 10*3/uL (ref 0.0–0.1)
IMMATURE GRANULOCYTES: 0 %
LYMPHS: 21 %
Lymphocytes Absolute: 2.2 10*3/uL (ref 0.7–3.1)
MCH: 28.8 pg (ref 26.6–33.0)
MCHC: 32.8 g/dL (ref 31.5–35.7)
MCV: 88 fL (ref 79–97)
Monocytes Absolute: 0.6 10*3/uL (ref 0.1–0.9)
Monocytes: 6 %
NEUTROS PCT: 71 %
Neutrophils Absolute: 7.2 10*3/uL — ABNORMAL HIGH (ref 1.4–7.0)
PLATELETS: 273 10*3/uL (ref 150–379)
RBC: 4.76 x10E6/uL (ref 3.77–5.28)
RDW: 13.6 % (ref 12.3–15.4)
WBC: 10.4 10*3/uL (ref 3.4–10.8)

## 2015-08-26 NOTE — Anesthesia Preprocedure Evaluation (Addendum)
Anesthesia Evaluation  Patient identified by MRN, date of birth, ID band Patient awake    Reviewed: Allergy & Precautions, H&P , NPO status , Patient's Chart, lab work & pertinent test results  History of Anesthesia Complications Negative for: history of anesthetic complications  Airway Mallampati: II  TM Distance: >3 FB Neck ROM: Full    Dental  (+) Teeth Intact, Dental Advisory Given   Pulmonary Current Smoker,    Pulmonary exam normal        Cardiovascular negative cardio ROS Normal cardiovascular exam     Neuro/Psych negative neurological ROS  negative psych ROS   GI/Hepatic negative GI ROS, Neg liver ROS,   Endo/Other  negative endocrine ROS  Renal/GU negative Renal ROS     Musculoskeletal   Abdominal   Peds  Hematology  (+) anemia ,   Anesthesia Other Findings   Reproductive/Obstetrics negative OB ROS                            Anesthesia Physical  Anesthesia Plan  ASA: II  Anesthesia Plan: General   Post-op Pain Management:    Induction: Intravenous  Airway Management Planned: LMA  Additional Equipment: None  Intra-op Plan:   Post-operative Plan: Extubation in OR  Informed Consent: I have reviewed the patients History and Physical, chart, labs and discussed the procedure including the risks, benefits and alternatives for the proposed anesthesia with the patient or authorized representative who has indicated his/her understanding and acceptance.   Dental advisory given  Plan Discussed with: Anesthesiologist  Anesthesia Plan Comments:        Anesthesia Quick Evaluation

## 2015-08-27 ENCOUNTER — Encounter (HOSPITAL_BASED_OUTPATIENT_CLINIC_OR_DEPARTMENT_OTHER): Payer: Self-pay | Admitting: *Deleted

## 2015-08-27 ENCOUNTER — Ambulatory Visit (HOSPITAL_BASED_OUTPATIENT_CLINIC_OR_DEPARTMENT_OTHER): Payer: 59 | Admitting: Anesthesiology

## 2015-08-27 ENCOUNTER — Encounter (HOSPITAL_BASED_OUTPATIENT_CLINIC_OR_DEPARTMENT_OTHER)
Admission: RE | Disposition: A | Discharge status: Home / Self Care | Payer: Self-pay | Source: Ambulatory Visit | Attending: General Surgery

## 2015-08-27 ENCOUNTER — Ambulatory Visit (HOSPITAL_BASED_OUTPATIENT_CLINIC_OR_DEPARTMENT_OTHER)
Admission: RE | Admit: 2015-08-27 | Discharge: 2015-08-27 | Disposition: A | Discharge status: Home / Self Care | Payer: 59 | Source: Ambulatory Visit | Attending: General Surgery | Admitting: General Surgery

## 2015-08-27 DIAGNOSIS — K612 Anorectal abscess: Secondary | ICD-10-CM | POA: Insufficient documentation

## 2015-08-27 DIAGNOSIS — K603 Anal fistula: Secondary | ICD-10-CM | POA: Diagnosis not present

## 2015-08-27 DIAGNOSIS — F17200 Nicotine dependence, unspecified, uncomplicated: Secondary | ICD-10-CM | POA: Insufficient documentation

## 2015-08-27 HISTORY — DX: Anal fistula, unspecified: K60.30

## 2015-08-27 HISTORY — PX: EVALUATION UNDER ANESTHESIA WITH ANAL FISTULECTOMY: SHX5621

## 2015-08-27 HISTORY — DX: Anal fistula: K60.3

## 2015-08-27 LAB — POCT PREGNANCY, URINE: Preg Test, Ur: NEGATIVE

## 2015-08-27 SURGERY — EXAM UNDER ANESTHESIA WITH ANAL FISTULECTOMY
Anesthesia: General

## 2015-08-27 MED ORDER — PROMETHAZINE HCL 25 MG/ML IJ SOLN
6.2500 mg | INTRAMUSCULAR | Status: DC | PRN
  Filled 2015-08-27: qty 1

## 2015-08-27 MED ORDER — LIDOCAINE HCL (CARDIAC) 20 MG/ML IV SOLN
INTRAVENOUS | Status: AC
  Filled 2015-08-27: qty 5

## 2015-08-27 MED ORDER — CEFOTETAN DISODIUM 2 G IJ SOLR
2.0000 g | INTRAMUSCULAR | Status: AC
  Administered 2015-08-27: 2 g via INTRAVENOUS
  Filled 2015-08-27: qty 2

## 2015-08-27 MED ORDER — SCOPOLAMINE 1 MG/3DAYS TD PT72
1.0000 | MEDICATED_PATCH | TRANSDERMAL | Status: DC
  Administered 2015-08-27: 1.5 mg via TRANSDERMAL
  Administered 2015-08-27: 1 via TRANSDERMAL
  Filled 2015-08-27: qty 1

## 2015-08-27 MED ORDER — OXYCODONE HCL 5 MG PO TABS
5.0000 mg | ORAL_TABLET | ORAL | Status: DC | PRN
  Filled 2015-08-27: qty 2

## 2015-08-27 MED ORDER — MIDAZOLAM HCL 5 MG/5ML IJ SOLN
INTRAMUSCULAR | Status: DC | PRN
  Administered 2015-08-27: 2 mg via INTRAVENOUS

## 2015-08-27 MED ORDER — LACTATED RINGERS IV SOLN
INTRAVENOUS | Status: DC
  Administered 2015-08-27 (×2): via INTRAVENOUS
  Filled 2015-08-27: qty 1000

## 2015-08-27 MED ORDER — PROPOFOL 10 MG/ML IV BOLUS
INTRAVENOUS | Status: DC | PRN
  Administered 2015-08-27: 50 mg via INTRAVENOUS
  Administered 2015-08-27: 200 mg via INTRAVENOUS

## 2015-08-27 MED ORDER — SODIUM CHLORIDE 0.9 % IR SOLN
Status: DC | PRN
  Administered 2015-08-27: 500 mL

## 2015-08-27 MED ORDER — ONDANSETRON HCL 4 MG/2ML IJ SOLN
INTRAMUSCULAR | Status: AC
  Filled 2015-08-27: qty 2

## 2015-08-27 MED ORDER — BUPIVACAINE LIPOSOME 1.3 % IJ SUSP
INTRAMUSCULAR | Status: DC | PRN
  Administered 2015-08-27: 20 mL

## 2015-08-27 MED ORDER — MIDAZOLAM HCL 2 MG/2ML IJ SOLN
INTRAMUSCULAR | Status: AC
  Filled 2015-08-27: qty 2

## 2015-08-27 MED ORDER — FENTANYL CITRATE (PF) 100 MCG/2ML IJ SOLN
INTRAMUSCULAR | Status: AC
  Filled 2015-08-27: qty 2

## 2015-08-27 MED ORDER — MORPHINE SULFATE (PF) 2 MG/ML IV SOLN
2.0000 mg | INTRAVENOUS | Status: DC | PRN
  Filled 2015-08-27: qty 2

## 2015-08-27 MED ORDER — FENTANYL CITRATE (PF) 100 MCG/2ML IJ SOLN
INTRAMUSCULAR | Status: DC | PRN
Start: 2015-08-27 — End: 2015-08-27
  Administered 2015-08-27 (×2): 25 ug via INTRAVENOUS
  Administered 2015-08-27: 50 ug via INTRAVENOUS

## 2015-08-27 MED ORDER — HYDROMORPHONE HCL 1 MG/ML IJ SOLN
0.2500 mg | INTRAMUSCULAR | Status: DC | PRN
  Filled 2015-08-27: qty 1

## 2015-08-27 MED ORDER — DEXAMETHASONE SODIUM PHOSPHATE 10 MG/ML IJ SOLN
INTRAMUSCULAR | Status: AC
  Filled 2015-08-27: qty 1

## 2015-08-27 MED ORDER — CEFOTETAN DISODIUM-DEXTROSE 2-2.08 GM-% IV SOLR
INTRAVENOUS | Status: AC
  Filled 2015-08-27: qty 50

## 2015-08-27 MED ORDER — SCOPOLAMINE 1 MG/3DAYS TD PT72
MEDICATED_PATCH | TRANSDERMAL | Status: AC
  Filled 2015-08-27: qty 1

## 2015-08-27 MED ORDER — OXYCODONE HCL 5 MG PO TABS: 5.0000 mg | ORAL_TABLET | ORAL | Status: DC | PRN

## 2015-08-27 MED ORDER — SODIUM CHLORIDE 0.9% FLUSH
3.0000 mL | INTRAVENOUS | Status: DC | PRN
  Filled 2015-08-27: qty 3

## 2015-08-27 MED ORDER — DEXAMETHASONE SODIUM PHOSPHATE 4 MG/ML IJ SOLN
INTRAMUSCULAR | Status: DC | PRN
  Administered 2015-08-27: 10 mg via INTRAVENOUS

## 2015-08-27 MED ORDER — ACETAMINOPHEN 650 MG RE SUPP
650.0000 mg | RECTAL | Status: DC | PRN
  Filled 2015-08-27: qty 1

## 2015-08-27 MED ORDER — ACETAMINOPHEN 325 MG PO TABS
650.0000 mg | ORAL_TABLET | ORAL | Status: DC | PRN
  Filled 2015-08-27: qty 2

## 2015-08-27 MED ORDER — KETOROLAC TROMETHAMINE 30 MG/ML IJ SOLN
INTRAMUSCULAR | Status: AC
  Filled 2015-08-27: qty 1

## 2015-08-27 MED ORDER — LIDOCAINE HCL (CARDIAC) 20 MG/ML IV SOLN
INTRAVENOUS | Status: DC | PRN
  Administered 2015-08-27: 80 mg via INTRAVENOUS

## 2015-08-27 MED ORDER — PROPOFOL 10 MG/ML IV BOLUS
INTRAVENOUS | Status: AC
  Filled 2015-08-27: qty 40

## 2015-08-27 MED ORDER — ONDANSETRON HCL 4 MG/2ML IJ SOLN
INTRAMUSCULAR | Status: DC | PRN
  Administered 2015-08-27: 4 mg via INTRAVENOUS

## 2015-08-27 SURGICAL SUPPLY — 46 items
BENZOIN TINCTURE PRP APPL 2/3 (GAUZE/BANDAGES/DRESSINGS) IMPLANT
BLADE CLIPPER SURG (BLADE) IMPLANT
BLADE SURG 15 STRL LF DISP TIS (BLADE) IMPLANT
BLADE SURG 15 STRL SS (BLADE)
BRIEF STRETCH FOR OB PAD LRG (UNDERPADS AND DIAPERS) ×3 IMPLANT
CANISTER SUCTION 1200CC (MISCELLANEOUS) ×3 IMPLANT
CANISTER SUCTION 2500CC (MISCELLANEOUS) IMPLANT
CATH ROBINSON RED A/P 16FR (CATHETERS) IMPLANT
CLEANER CAUTERY TIP 5X5 PAD (MISCELLANEOUS) ×1 IMPLANT
COVER BACK TABLE 60X90IN (DRAPES) ×3 IMPLANT
DRAIN PENROSE 18X1/4 LTX STRL (WOUND CARE) IMPLANT
DRAPE LAPAROTOMY 100X72 PEDS (DRAPES) IMPLANT
DRAPE LG THREE QUARTER DISP (DRAPES) ×3 IMPLANT
DRAPE UNDERBUTTOCKS STRL (DRAPE) ×3 IMPLANT
ELECT REM PT RETURN 9FT ADLT (ELECTROSURGICAL) ×3
ELECTRODE REM PT RTRN 9FT ADLT (ELECTROSURGICAL) ×1 IMPLANT
GAUZE PACKING IODOFORM 2 (PACKING) IMPLANT
GELFOAM SMALL 12 7MM 315 3 (MISCELLANEOUS) IMPLANT
GLOVE BIO SURGEON STRL SZ8 (GLOVE) ×3 IMPLANT
GLOVE BIOGEL PI IND STRL 8 (GLOVE) ×1 IMPLANT
GLOVE BIOGEL PI INDICATOR 8 (GLOVE) ×2
GOWN STRL REUS W/ TWL LRG LVL3 (GOWN DISPOSABLE) ×1 IMPLANT
GOWN STRL REUS W/TWL LRG LVL3 (GOWN DISPOSABLE) ×2
KIT ROOM TURNOVER WOR (KITS) ×3 IMPLANT
LEGGING LITHOTOMY PAIR STRL (DRAPES) ×3 IMPLANT
LOOP VESSEL MAXI BLUE (MISCELLANEOUS) IMPLANT
NEEDLE HYPO 22GX1.5 SAFETY (NEEDLE) ×3 IMPLANT
PACK BASIN DAY SURGERY FS (CUSTOM PROCEDURE TRAY) ×3 IMPLANT
PAD ABD 8X10 STRL (GAUZE/BANDAGES/DRESSINGS) ×3 IMPLANT
PAD CLEANER CAUTERY TIP 5X5 (MISCELLANEOUS) ×2
PAD PREP 24X48 CUFFED NSTRL (MISCELLANEOUS) ×3 IMPLANT
PENCIL BUTTON HOLSTER BLD 10FT (ELECTRODE) ×3 IMPLANT
SPONGE GAUZE 4X4 12PLY STER LF (GAUZE/BANDAGES/DRESSINGS) ×3 IMPLANT
SURGILUBE 2OZ TUBE FLIPTOP (MISCELLANEOUS) ×3 IMPLANT
SUT CHROMIC 2 0 SH (SUTURE) IMPLANT
SUT CHROMIC 3 0 SH 27 (SUTURE) IMPLANT
SUT ETHIBOND 0 (SUTURE) IMPLANT
SUT SILK 0 TIES 10X30 (SUTURE) IMPLANT
SYR CONTROL 10ML LL (SYRINGE) ×3 IMPLANT
TOWEL OR 17X24 6PK STRL BLUE (TOWEL DISPOSABLE) ×6 IMPLANT
TRAY DSU PREP LF (CUSTOM PROCEDURE TRAY) ×3 IMPLANT
TUBE CONNECTING 12'X1/4 (SUCTIONS) ×1
TUBE CONNECTING 12X1/4 (SUCTIONS) ×2 IMPLANT
UNDERPAD 30X30 INCONTINENT (UNDERPADS AND DIAPERS) IMPLANT
WATER STERILE IRR 500ML POUR (IV SOLUTION) ×3 IMPLANT
YANKAUER SUCT BULB TIP NO VENT (SUCTIONS) ×3 IMPLANT

## 2015-08-27 NOTE — Anesthesia Postprocedure Evaluation (Signed)
Anesthesia Post Note  Patient: Alyssa Cordova  Procedure(s) Performed: Procedure(s) (LRB): EXAM UNDER ANESTHESIA WITH ANAL FISTULATOMY (N/A)  Patient location during evaluation: PACU Anesthesia Type: General Level of consciousness: sedated Pain management: pain level controlled Vital Signs Assessment: post-procedure vital signs reviewed and stable Respiratory status: spontaneous breathing and respiratory function stable Cardiovascular status: stable Anesthetic complications: no    Last Vitals:  Filed Vitals:   08/27/15 0845 08/27/15 0900  BP: 126/78 129/80  Pulse: 62 59  Temp:    Resp: 14 16    Last Pain:  Filed Vitals:   08/27/15 0901  PainSc: 0-No pain                 Caylie Sandquist DANIEL

## 2015-08-27 NOTE — Interval H&P Note (Signed)
History and Physical Interval Note:  08/27/2015 7:32 AM  Alyssa Cordova  has presented today for surgery, with the diagnosis of anal fistula  The various methods of treatment have been discussed with the patient and family. After consideration of risks, benefits and other options for treatment, the patient has consented to  Procedure(s): EXAM UNDER ANESTHESIA WITH ANAL FISTULA (N/A) POSSIBLE PLACEMENT OF SETON (N/A) as a surgical intervention .  The patient's history has been reviewed, patient examined, no change in status, stable for surgery.  I have reviewed the patient's chart and labs.  Questions were answered to the patient's satisfaction.     Aili Casillas JShela Commons

## 2015-08-27 NOTE — Anesthesia Procedure Notes (Signed)
Procedure Name: LMA Insertion Date/Time: 08/27/2015 7:40 AM Performed by: Tyrone NineSAUVE, Alyssa Cordova F Pre-anesthesia Checklist: Patient identified, Timeout performed, Emergency Drugs available, Suction available and Patient being monitored Patient Re-evaluated:Patient Re-evaluated prior to inductionOxygen Delivery Method: Circle system utilized Preoxygenation: Pre-oxygenation with 100% oxygen Ventilation: Mask ventilation without difficulty LMA: LMA inserted LMA Size: 4.0 Number of attempts: 1 Airway Equipment and Method: Bite block Placement Confirmation: breath sounds checked- equal and bilateral and positive ETCO2 Tube secured with: Tape Dental Injury: Teeth and Oropharynx as per pre-operative assessment

## 2015-08-27 NOTE — H&P (Signed)
H & P  In August, she noticed a painful lump in the perianal area. She incised this sharply and had some purulent drainage. Since that time, she had been on 5 different courses of antibiotics. She continues to have persistent drainage from the area. There is a concern for an anal fistula and she's been sent here for further evaluation. No incontinence. She had a vaginal delivery a year and half ago and had an episiotomy with that.  Other Problems  No pertinent past medical history  Past Surgical History  Oral Surgery  Diagnostic Studies History  Colonoscopy never Mammogram never Pap Smear 1-5 years ago  Allergies  No Known Drug Allergies  Prior to Admission medications   Medication Sig Start Date End Date Taking? Authorizing Provider  norethindrone (ORTHO MICRONOR) 0.35 MG tablet Take 1 tablet (0.35 mg total) by mouth daily. Patient taking differently: Take 1 tablet by mouth every evening.  05/08/15  Yes Mary-Margaret Daphine DeutscherMartin, FNP     Social History  Caffeine use Carbonated beverages, Coffee, Tea. No alcohol use No drug use Tobacco use Never smoker.  Family History  Diabetes Mellitus Mother. Heart Disease Mother. Kidney Disease Mother. Respiratory Condition Mother.  Pregnancy / Birth History  Age at menarche 12 years. Contraceptive History Oral contraceptives. Gravida 1 Maternal age 29-30 Para 1 Regular periods     Review of Systems  General Not Present- Appetite Loss, Chills, Fatigue, Fever, Night Sweats, Weight Gain and Weight Loss. Skin Present- New Lesions. Not Present- Change in Wart/Mole, Dryness, Hives, Jaundice, Non-Healing Wounds, Rash and Ulcer. HEENT Not Present- Earache, Hearing Loss, Hoarseness, Nose Bleed, Oral Ulcers, Ringing in the Ears, Seasonal Allergies, Sinus Pain, Sore Throat, Visual Disturbances, Wears glasses/contact lenses and Yellow Eyes. Respiratory Not Present- Bloody sputum, Chronic Cough, Difficulty Breathing,  Snoring and Wheezing. Breast Not Present- Breast Mass, Breast Pain, Nipple Discharge and Skin Changes. Cardiovascular Not Present- Chest Pain, Difficulty Breathing Lying Down, Leg Cramps, Palpitations, Rapid Heart Rate, Shortness of Breath and Swelling of Extremities. Gastrointestinal Not Present- Abdominal Pain, Bloating, Bloody Stool, Change in Bowel Habits, Chronic diarrhea, Constipation, Difficulty Swallowing, Excessive gas, Gets full quickly at meals, Hemorrhoids, Indigestion, Nausea, Rectal Pain and Vomiting. Female Genitourinary Not Present- Frequency, Nocturia, Painful Urination, Pelvic Pain and Urgency. Musculoskeletal Not Present- Back Pain, Joint Pain, Joint Stiffness, Muscle Pain, Muscle Weakness and Swelling of Extremities. Neurological Not Present- Decreased Memory, Fainting, Headaches, Numbness, Seizures, Tingling, Tremor, Trouble walking and Weakness. Psychiatric Not Present- Anxiety, Bipolar, Change in Sleep Pattern, Depression, Fearful and Frequent crying. Endocrine Not Present- Cold Intolerance, Excessive Hunger, Hair Changes, Heat Intolerance, Hot flashes and New Diabetes. Hematology Not Present- Easy Bruising, Excessive bleeding, Gland problems, HIV and Persistent Infections.    Physical Exam  The physical exam findings are as follows: Note:General: Slightly overweight female in NAD. Pleasant and cooperative.  CV: RRR, no murmur, no JVD.  CHEST: Breath sounds equal and clear. Respirations nonlabored.  ANORECTAL: Punctate draining wound at 2:00 position of perianal area. On DRE, a tender irregular area is felt in the anterior anal cannula. A small lacrimal duct probe was used to cannulate the draining wound and this was able to track to an area where I could feel the tip of the probe in the anal canal on digital rectal exam consistent with fistula.  NEUROLOGIC: Alert and oriented, answers questions appropriately.  PSYCHIATRIC: Normal mood, affect , and  behavior.    Assessment & Plan   ANAL FISTULA (K60.3) Impression: This is tracking from the approximate  2 o'clock position to the anterior anal area.  Plan: I recommended EUA and repair of anal fistula by way of fistulotomy as long as her is not significant disruption of the anal sphincter muscle doing this. If so, then I would place a seton and come back at a later time to do the fistula. We discussed the procedure, rationale, and risks. Risks include but are not limited to bleeding, recurrent infection or fistula, incontinence, anesthesia, poor wound healing. She seems to understand this and agrees with the plan.  Avel Peace, MD

## 2015-08-27 NOTE — Op Note (Signed)
Operative Note  Alyssa SpencerChristy A Ascencio female 30 y.o. 08/27/2015  PREOPERATIVE DX:  Recurrent anorectal abscess with anal fistula   POSTOPERATIVE DX:  Same  PROCEDURE:   Exam under anesthesia, anoscopy, anal fistulotomy         Surgeon: Adolph PollackOSENBOWER,Fenris Cauble J   Assistants: None   Anesthesia: General LMA anesthesia  Indications:   With recurrent anorectal abscesses at the 2:00 position. On examining office, she had what appeared to be a fistula tract from the 2:00 position to the posterior area. She now presents for the above procedure.    Procedure Detail:  She was seen in the holding area. She was brought to the operating room placed supine on the operating table and general anesthetic was given. She was placed in the lithotomy position. The perianal area was sterilely prepped and draped. A timeout was performed.  At the 2:00 perianal position a small punctate lesion was noted. Pressure upon the lesion demonstrated some purulent drainage coming from it. Digital rectal exam did not demonstrate any masses. Anoscopy did not demonstrate any masses. There was an irregular area posteriorly. Using a lacrimal duct probe I noticed a tract going toward the posterior area. I began to incise some of the superficial tract over the probe. I then used a smaller probe to find the fistula tract which went from the 2:00 position directly to the posterior irregular area. This was superficial and involved no muscle. Using electrocautery, fistulotomy was performed. Bleeding was controlled with electrocautery.  Once hemostasis was adequate, a perianal block was performed with Exparel.  A bulky dry dressing was then applied.  She tolerated the procedure well without any apparent complications. She was taken recovery room in satisfactory condition.There was minimal blood loss.  Findings: Fistula tract between posterior anal canal and 2:00 position.        Complications:  * No complications entered in OR log *   Disposition: PACU - hemodynamically stable.         Condition: stable

## 2015-08-27 NOTE — Transfer of Care (Signed)
Immediate Anesthesia Transfer of Care Note  Patient: Alyssa Cordova  Procedure(s) Performed: Procedure(s): EXAM UNDER ANESTHESIA WITH ANAL FISTULATOMY (N/A)  Patient Location: PACU  Anesthesia Type:General  Level of Consciousness: awake, alert , oriented and patient cooperative  Airway & Oxygen Therapy: Patient Spontanous Breathing and Patient connected to nasal cannula oxygen  Post-op Assessment: Report given to RN and Post -op Vital signs reviewed and stable  Post vital signs: Reviewed and stable  Last Vitals:  Filed Vitals:   08/27/15 0615  BP: 130/81  Pulse: 78  Temp: 36.9 C  Resp: 16    Last Pain: There were no vitals filed for this visit.    Patients Stated Pain Goal: 5 (08/27/15 0630)  Complications: No apparent anesthesia complications

## 2015-08-27 NOTE — Discharge Instructions (Addendum)
[facility] _______[facility]  RECTAL SURGERY POST OP INSTRUCTIONS: POST OP INSTRUCTIONS  Always review your discharge instruction sheet given to you by the facility where your surgery was performed. IF YOU HAVE DISABILITY OR FAMILY LEAVE FORMS, YOU MUST BRING THEM TO THE OFFICE FOR PROCESSING.   DO NOT GIVE THEM TO YOUR DOCTOR.  1. A  prescription for pain medication may be given to you upon discharge.  Take your pain medication as prescribed, if needed.  If narcotic pain medicine is not needed, then you may take acetaminophen (Tylenol) or ibuprofen (Advil) as needed. 2. Take your usually prescribed medications unless otherwise directed. 3. If you need a refill on your pain medication, please contact your pharmacy.  They will contact our office to request authorization. Prescriptions will not be filled after 5 pm or on week-ends. 4. You should follow a light diet the first 24 hours after arrival home, such as soup and crackers, etc.  Be sure to include lots of fluids daily.  Resume your normal diet 2-3 days after surgery.. 5. Most patients will experience some swelling and discomfort in the rectal area. Ice packs, reclining and warm tub soaks will help.  Swelling and discomfort can take several days to resolve.  6. It is common to experience some constipation if taking pain medication after surgery.  Increasing fluid intake and taking a stool softener (such as Colace) will usually help or prevent this problem from occurring.  A mild laxative (Milk of Magnesia or Miralax) should be taken according to package directions if there are no bowel movements after 48 hours. 7. Unless discharge instructions indicate otherwise, leave your bandage dry and in place for 24 hours, or remove the bandage if you have a bowel movement. You may notice a small amount of bleeding with bowel movements for the first few days. You may have some packing in the rectum which will come out over the first day or two. You  will need to wear an absorbent pad or soft cotton gauze in your underwear until the drainage stops.it. 8. ACTIVITIES:  You may resume regular (light) daily activities beginning the next day--such as daily self-care, walking, climbing stairs--gradually increasing activities as tolerated.  You may have sexual intercourse when it is comfortable.  Refrain from any heavy lifting or straining for at least 3 days until you have minimal to no pain. a. You may drive when you are no longer taking prescription pain medication, you can comfortably wear a seatbelt, and you can safely maneuver your car and apply brakes. b. RETURN TO WORK: : _3-7 days when comfortable___________________ c.  9. You should see your doctor in the office for a follow-up appointment approximately 2-3 weeks after your surgery.  Make sure that you call for this appointment within a day or two after you arrive home to insure a convenient appointment time. 10. OTHER INSTRUCTIONS:  __________________________________________________________________________________________________________________________________________________________________________________________  WHEN TO CALL YOUR DOCTOR: 1. Fever over 101.0 2. Inability to urinate 3. Nausea and/or vomiting 4. Extreme swelling or bruising 5. Continued bleeding from rectum. 6. Increased pain, redness, or drainage from the incision 7. Constipation  The clinic staff is available to answer your questions during regular business hours.  Please dont hesitate to call and ask to speak to one of the nurses for clinical concerns.  If you have a medical emergency, go to the nearest emergency room or call 911.  A surgeon from [facility] is always on call at the hospital  [address], [address], [place], [place]  [id number] ?  [address] [address], [place], [place]   [id number] [phone] ? [phone] ? FAX [phone] Web site: [email or web address]  Post  Anesthesia Home Care Instructions  Activity: Get plenty of rest for the remainder of the day. A responsible adult should stay with you for 24 hours following the procedure.  For the next 24 hours, DO NOT: -Drive a car -Advertising copywriterperate machinery -Drink alcoholic beverages -Take any medication unless instructed by your physician -Make any legal decisions or sign important papers.  Meals: Start with liquid foods such as gelatin or soup. Progress to regular foods as tolerated. Avoid greasy, spicy, heavy foods. If nausea and/or vomiting occur, drink only clear liquids until the nausea and/or vomiting subsides. Call your physician if vomiting continues.  Special Instructions/Symptoms: Your throat may feel dry or sore from the anesthesia or the breathing tube placed in your throat during surgery. If this causes discomfort, gargle with warm salt water. The discomfort should disappear within 24 hours.  If you had a scopolamine patch placed behind your ear for the management of post- operative nausea and/or vomiting:  1. The medication in the patch is effective for 72 hours, after which it should be removed.  Wrap patch in a tissue and discard in the trash. Wash hands thoroughly with soap and water. 2. You may remove the patch earlier than 72 hours if you experience unpleasant side effects which may include dry mouth, dizziness or visual disturbances. 3. Avoid touching the patch. Wash your hands with soap and water after contact with the patch.   Information for Discharge Teaching: EXPAREL (bupivacaine liposome injectable suspension)   Your surgeon gave you EXPAREL(bupivacaine) in your surgical incision to help control your pain after surgery.   EXPAREL is a local anesthetic that provides pain relief by numbing the tissue around the surgical site.  EXPAREL is designed to release pain medication over time and can control pain for up to 72 hours.  Depending on how you respond to EXPAREL, you may require  less pain medication during your recovery.  Possible side effects:  Temporary loss of sensation or ability to move in the area where bupivacaine was injected.  Nausea, vomiting, constipation  Rarely, numbness and tingling in your mouth or lips, lightheadedness, or anxiety may occur.  Call your doctor right away if you think you may be experiencing any of these sensations, or if you have other questions regarding possible side effects.  Follow all other discharge instructions given to you by your surgeon or nurse. Eat a healthy diet and drink plenty of water or other fluids.  If you return to the hospital for any reason within 96 hours following the administration of EXPAREL, please inform your health care providers.

## 2015-08-28 ENCOUNTER — Encounter (HOSPITAL_BASED_OUTPATIENT_CLINIC_OR_DEPARTMENT_OTHER): Payer: Self-pay | Admitting: General Surgery

## 2015-12-10 ENCOUNTER — Telehealth: Payer: Self-pay | Admitting: Family Medicine

## 2015-12-10 NOTE — Telephone Encounter (Signed)
Pt states that her NP appt is 10/4 w/[facility] and asking if there is any way that [facility] could put in an order for her to have labs done due to [facility] moving up the date for [facility]

## 2015-12-10 NOTE — Telephone Encounter (Signed)
Pt has been placed on the waitlist

## 2015-12-10 NOTE — Telephone Encounter (Signed)
We cannot place orders without seeing patient as insurance will not approve these. We need to see her for diagnosis codes.

## 2015-12-11 NOTE — Telephone Encounter (Signed)
Pt is aware that she has been placed on a wait list for NP appt and has been told if we are unable to get her in sooner that an order for labs can be placed a wk before appt with an understanding that she keeps her NP appt on 10/4 as a CPE appt. any other concerns will have to be addressed at another appt. time . Pt voiced an understanding.

## 2016-01-19 ENCOUNTER — Encounter: Payer: Self-pay | Admitting: Emergency Medicine

## 2016-01-20 ENCOUNTER — Ambulatory Visit (INDEPENDENT_AMBULATORY_CARE_PROVIDER_SITE_OTHER): Payer: 59 | Admitting: Family Medicine

## 2016-01-20 ENCOUNTER — Encounter: Payer: Self-pay | Admitting: Family Medicine

## 2016-01-20 VITALS — BP 118/78 | HR 73 | Temp 98.3°F | Resp 16 | Ht 71.0 in | Wt 210.1 lb

## 2016-01-20 DIAGNOSIS — Z0000 Encounter for general adult medical examination without abnormal findings: Secondary | ICD-10-CM

## 2016-01-20 LAB — CBC WITH DIFFERENTIAL/PLATELET
BASOS ABS: 0 {cells}/uL (ref 0–200)
Basophils Relative: 0 %
EOS PCT: 1 %
Eosinophils Absolute: 90 cells/uL (ref 15–500)
HCT: 40 % (ref 35.0–45.0)
HEMOGLOBIN: 13.6 g/dL (ref 11.7–15.5)
LYMPHS PCT: 26 %
Lymphs Abs: 2340 cells/uL (ref 850–3900)
MCH: 29.6 pg (ref 27.0–33.0)
MCHC: 34 g/dL (ref 32.0–36.0)
MCV: 87 fL (ref 80.0–100.0)
MONOS PCT: 7 %
MPV: 10.7 fL (ref 7.5–12.5)
Monocytes Absolute: 630 cells/uL (ref 200–950)
NEUTROS PCT: 66 %
Neutro Abs: 5940 cells/uL (ref 1500–7800)
PLATELETS: 256 10*3/uL (ref 140–400)
RBC: 4.6 MIL/uL (ref 3.80–5.10)
RDW: 13.3 % (ref 11.0–15.0)
WBC: 9 10*3/uL (ref 3.8–10.8)

## 2016-01-20 LAB — TSH: TSH: 0.81 mIU/L

## 2016-01-20 NOTE — Progress Notes (Signed)
   Subjective:    Patient ID: Alyssa Cordova, female    DOB: [birth year], 30 y.o.   MRN: [id number]  HPI New to establish.  UTD on GYN ([facility]).  Last pap 07/2015.  CPE- no complaints   Review of Systems Patient reports no vision/ hearing changes, adenopathy,fever, weight change,  persistant/recurrent hoarseness , swallowing issues, chest pain, palpitations, edema, persistant/recurrent cough, hemoptysis, dyspnea (rest/exertional/paroxysmal nocturnal), gastrointestinal bleeding (melena, rectal bleeding), abdominal pain, significant heartburn, bowel changes, GU symptoms (dysuria, hematuria, incontinence), Gyn symptoms (abnormal  bleeding, pain),  syncope, focal weakness, memory loss, numbness & tingling, skin/hair/nail changes, abnormal bruising or bleeding, anxiety, or depression.     Objective:   Physical Exam General Appearance:    Alert, cooperative, no distress, appears stated age  Head:    Normocephalic, without obvious abnormality, atraumatic  Eyes:    PERRL, conjunctiva/corneas clear, EOM's intact, fundi    benign, both eyes  Ears:    Normal TM's and external ear canals, both ears  Nose:   Nares normal, septum midline, mucosa normal, no drainage    or sinus tenderness  Throat:   Lips, mucosa, and tongue normal; teeth and gums normal  Neck:   Supple, symmetrical, trachea midline, no adenopathy;    Thyroid: no enlargement/tenderness/nodules  Back:     Symmetric, no curvature, ROM normal, no CVA tenderness  Lungs:     Clear to auscultation bilaterally, respirations unlabored  Chest Wall:    No tenderness or deformity   Heart:    Regular rate and rhythm, S1 and S2 normal, no murmur, rub   or gallop  Breast Exam:    Deferred to GYN  Abdomen:     Soft, non-tender, bowel sounds active all four quadrants,    no masses, no organomegaly  Genitalia:    Deferred to GYN  Rectal:    Extremities:   Extremities normal, atraumatic, no cyanosis or edema  Pulses:   2+ and symmetric all  extremities  Skin:   Skin color, texture, turgor normal, no rashes or lesions  Lymph nodes:   Cervical, supraclavicular, and axillary nodes normal  Neurologic:   CNII-XII intact, normal strength, sensation and reflexes    throughout          Assessment & Plan:  CPE- pt's PE WNL w/ exception of being overweight.  UTD on GYN.  Check labs.  Anticipatory guidance provided.

## 2016-01-20 NOTE — Progress Notes (Signed)
Pre visit review using our clinic review tool, if applicable. No additional management support is needed unless otherwise documented below in the visit note. 

## 2016-01-20 NOTE — Patient Instructions (Signed)
Follow up in 1 year or as needed We'll notify you of your lab results and make any changes if needed Continue to work on healthy diet and regular exercise- I know it's hard w/ the busy schedule! Call with any questions or concerns Welcome!!  We're glad to have you!!!

## 2016-01-21 LAB — VITAMIN D 25 HYDROXY (VIT D DEFICIENCY, FRACTURES): VIT D 25 HYDROXY: 36 ng/mL (ref 30–100)

## 2016-01-21 LAB — LIPID PANEL
CHOL/HDL RATIO: 3.1 ratio (ref <=5.0)
CHOLESTEROL: 190 mg/dL (ref 125–200)
HDL: 62 mg/dL (ref >=46)
LDL Cholesterol: 106 mg/dL (ref <130)
TRIGLYCERIDES: 112 mg/dL (ref <150)
VLDL: 22 mg/dL (ref <30)

## 2016-01-21 LAB — BASIC METABOLIC PANEL
BUN: 12 mg/dL (ref 7–25)
CHLORIDE: 102 mmol/L (ref 98–110)
CO2: 24 mmol/L (ref 20–31)
CREATININE: 0.9 mg/dL (ref 0.50–1.10)
Calcium: 9.4 mg/dL (ref 8.6–10.2)
GLUCOSE: 90 mg/dL (ref 65–99)
POTASSIUM: 3.7 mmol/L (ref 3.5–5.3)
Sodium: 136 mmol/L (ref 135–146)

## 2016-01-21 LAB — HEPATIC FUNCTION PANEL
ALBUMIN: 4.2 g/dL (ref 3.6–5.1)
ALK PHOS: 62 U/L (ref 33–115)
ALT: 14 U/L (ref 6–29)
AST: 17 U/L (ref 10–30)
Bilirubin, Direct: 0.1 mg/dL (ref <=0.2)
Indirect Bilirubin: 0.4 mg/dL (ref 0.2–1.2)
TOTAL PROTEIN: 7.4 g/dL (ref 6.1–8.1)
Total Bilirubin: 0.5 mg/dL (ref 0.2–1.2)

## 2016-02-03 ENCOUNTER — Ambulatory Visit: Payer: 59 | Admitting: Family Medicine

## 2016-03-02 DIAGNOSIS — Z3202 Encounter for pregnancy test, result negative: Secondary | ICD-10-CM | POA: Diagnosis not present

## 2016-03-02 DIAGNOSIS — Z30430 Encounter for insertion of intrauterine contraceptive device: Secondary | ICD-10-CM | POA: Diagnosis not present

## 2016-03-25 DIAGNOSIS — M545 Low back pain: Secondary | ICD-10-CM | POA: Diagnosis not present

## 2016-06-24 ENCOUNTER — Other Ambulatory Visit: Payer: Self-pay | Admitting: Family Medicine

## 2016-06-24 ENCOUNTER — Other Ambulatory Visit: Payer: Self-pay | Admitting: Family

## 2016-06-24 MED ORDER — AZITHROMYCIN 250 MG PO TABS: ORAL_TABLET | ORAL | 0 refills | Status: DC

## 2016-09-08 ENCOUNTER — Other Ambulatory Visit: Payer: Self-pay | Admitting: Nurse Practitioner

## 2016-09-08 MED ORDER — IVERMECTIN 0.5 % EX LOTN: TOPICAL_LOTION | CUTANEOUS | 1 refills | Status: DC

## 2016-09-13 ENCOUNTER — Other Ambulatory Visit: Payer: Self-pay

## 2016-09-13 MED ORDER — SULFAMETHOXAZOLE-TRIMETHOPRIM 800-160 MG PO TABS: 1.0000 | ORAL_TABLET | Freq: Two times a day (BID) | ORAL | 0 refills | Status: DC

## 2016-09-20 ENCOUNTER — Telehealth: Payer: 59 | Admitting: Nurse Practitioner

## 2016-09-20 DIAGNOSIS — N3000 Acute cystitis without hematuria: Secondary | ICD-10-CM | POA: Diagnosis not present

## 2016-09-20 MED ORDER — CIPROFLOXACIN HCL 500 MG PO TABS: 500.0000 mg | ORAL_TABLET | Freq: Two times a day (BID) | ORAL | 0 refills | Status: DC

## 2016-09-20 NOTE — Progress Notes (Signed)

## 2016-10-27 ENCOUNTER — Encounter: Payer: Self-pay | Admitting: Nurse Practitioner

## 2016-10-27 ENCOUNTER — Ambulatory Visit (INDEPENDENT_AMBULATORY_CARE_PROVIDER_SITE_OTHER): Payer: 59 | Admitting: Nurse Practitioner

## 2016-10-27 VITALS — BP 123/77 | HR 84 | Temp 96.8°F | Resp 16 | Ht 71.0 in | Wt 223.4 lb

## 2016-10-27 DIAGNOSIS — Z6831 Body mass index (BMI) 31.0-31.9, adult: Secondary | ICD-10-CM | POA: Insufficient documentation

## 2016-10-27 DIAGNOSIS — Z0000 Encounter for general adult medical examination without abnormal findings: Secondary | ICD-10-CM | POA: Diagnosis not present

## 2016-10-27 LAB — URINALYSIS, COMPLETE
Bilirubin, UA: NEGATIVE
Glucose, UA: NEGATIVE
KETONES UA: NEGATIVE
LEUKOCYTES UA: NEGATIVE
NITRITE UA: NEGATIVE
Protein, UA: NEGATIVE
RBC, UA: NEGATIVE
SPEC GRAV UA: 1.02 (ref 1.005–1.030)
Urobilinogen, Ur: 0.2 mg/dL (ref 0.2–1.0)
pH, UA: 6 (ref 5.0–7.5)

## 2016-10-27 LAB — MICROSCOPIC EXAMINATION
BACTERIA UA: NONE SEEN
Renal Epithel, UA: NONE SEEN /hpf

## 2016-10-27 MED ORDER — PHENTERMINE HCL 37.5 MG PO TABS: 37.5000 mg | ORAL_TABLET | Freq: Every day | ORAL | 3 refills | Status: DC

## 2016-10-27 NOTE — Addendum Note (Signed)
Addended by: Bennie PieriniMARTIN, MARY-MARGARET on: 10/27/2016 08:17 AM   Modules accepted: Orders

## 2016-10-27 NOTE — Progress Notes (Signed)
   Subjective:    Patient ID: Alyssa Cordova, female    DOB: [birth year], 31 y.o.   MRN: [id number]  HPI Patient comes in today for annual physical exam with  o PAP. SHe is doing well. She has no current medical problems and the only meds she is on birth control. Her only concern today is weight gain. She had a mirena inserted 6 months ago and has continued to gain weight since then.    Review of Systems  Constitutional: Negative for activity change and appetite change.  HENT: Negative.   Eyes: Negative for pain.  Respiratory: Negative for shortness of breath.   Cardiovascular: Negative for chest pain, palpitations and leg swelling.  Gastrointestinal: Negative for abdominal pain.  Endocrine: Negative for polydipsia.  Genitourinary: Negative.   Skin: Negative for rash.  Neurological: Negative for dizziness, weakness and headaches.  Hematological: Does not bruise/bleed easily.  Psychiatric/Behavioral: Negative.   All other systems reviewed and are negative.      Objective:   Physical Exam  Constitutional: She is oriented to person, place, and time. She appears well-developed and well-nourished.  HENT:  Nose: Nose normal.  Mouth/Throat: Oropharynx is clear and moist.  Eyes: EOM are normal.  Neck: Trachea normal, normal range of motion and full passive range of motion without pain. Neck supple. No JVD present. Carotid bruit is not present. No thyromegaly present.  Cardiovascular: Normal rate, regular rhythm, normal heart sounds and intact distal pulses.  Exam reveals no gallop and no friction rub.   No murmur heard. Pulmonary/Chest: Effort normal and breath sounds normal.  Abdominal: Soft. Bowel sounds are normal. She exhibits no distension and no mass. There is no tenderness.  Musculoskeletal: Normal range of motion.  Lymphadenopathy:    She has no cervical adenopathy.  Neurological: She is alert and oriented to person, place, and time. She has normal reflexes.  Skin: Skin is  warm and dry.  Psychiatric: She has a normal mood and affect. Her behavior is normal. Judgment and thought content normal.     BP 123/77   Pulse 84   Temp (!) 96.8 F (36 C) (Oral)   Resp 16   Ht _0  (1.803 m)   Wt 223 lb 6.4 oz (101.3 kg)   BMI 31.16 kg/m        Assessment & Plan:  1. Annual physical exam  CBC with Differential/Platelet - CMP14+EGFR - Lipid panel - Thyroid Panel With TSH  2. BMI 31.0-31.9,adult Discussed diet and exercise for person with BMI >25 Will recheck weight in 3-6 months - phentermine (ADIPEX-P) 37.5 MG tablet; Take 1 tablet (37.5 mg total) by mouth daily before breakfast.  Dispense: 30 tablet; Refill: 3    Labs pending Health maintenance reviewed Diet and exercise encouraged Continue all meds Follow up  In 1 year   [place], FNP

## 2016-10-27 NOTE — Patient Instructions (Signed)
Exercising to Lose Weight Exercising can help you to lose weight. In order to lose weight through exercise, you need to do vigorous-intensity exercise. You can tell that you are exercising with vigorous intensity if you are breathing very hard and fast and cannot hold a conversation while exercising. Moderate-intensity exercise helps to maintain your current weight. You can tell that you are exercising at a moderate level if you have a higher heart rate and faster breathing, but you are still able to hold a conversation. How often should I exercise? Choose an activity that you enjoy and set realistic goals. Your health care provider can help you to make an activity plan that works for you. Exercise regularly as directed by your health care provider. This may include:  Doing resistance training twice each week, such as: ? Push-ups. ? Sit-ups. ? Lifting weights. ? Using resistance bands.  Doing a given intensity of exercise for a given amount of time. Choose from these options: ? 150 minutes of moderate-intensity exercise every week. ? 75 minutes of vigorous-intensity exercise every week. ? A mix of moderate-intensity and vigorous-intensity exercise every week.  Children, pregnant women, people who are out of shape, people who are overweight, and older adults may need to consult a health care provider for individual recommendations. If you have any sort of medical condition, be sure to consult your health care provider before starting a new exercise program. What are some activities that can help me to lose weight?  Walking at a rate of at least 4.5 miles an hour.  Jogging or running at a rate of 5 miles per hour.  Biking at a rate of at least 10 miles per hour.  Lap swimming.  Roller-skating or in-line skating.  Cross-country skiing.  Vigorous competitive sports, such as football, basketball, and soccer.  Jumping rope.  Aerobic dancing. How can I be more active in my day-to-day  activities?  Use the stairs instead of the elevator.  Take a walk during your lunch break.  If you drive, park your car farther away from work or school.  If you take public transportation, get off one stop early and walk the rest of the way.  Make all of your phone calls while standing up and walking around.  Get up, stretch, and walk around every 30 minutes throughout the day. What guidelines should I follow while exercising?  Do not exercise so much that you hurt yourself, feel dizzy, or get very short of breath.  Consult your health care provider prior to starting a new exercise program.  Wear comfortable clothes and shoes with good support.  Drink plenty of water while you exercise to prevent dehydration or heat stroke. Body water is lost during exercise and must be replaced.  Work out until you breathe faster and your heart beats faster. This information is not intended to replace advice given to you by your health care provider. Make sure you discuss any questions you have with your health care provider. Document Released: 05/21/2010 Document Revised: 09/24/2015 Document Reviewed: 09/19/2013 Elsevier Interactive Patient Education  2018 Elsevier Inc.  

## 2016-10-28 LAB — LIPID PANEL
CHOL/HDL RATIO: 3.6 ratio (ref 0.0–4.4)
Cholesterol, Total: 146 mg/dL (ref 100–199)
HDL: 41 mg/dL (ref >39)
LDL Calculated: 92 mg/dL (ref 0–99)
Triglycerides: 65 mg/dL (ref 0–149)
VLDL CHOLESTEROL CAL: 13 mg/dL (ref 5–40)

## 2016-10-28 LAB — CBC WITH DIFFERENTIAL/PLATELET
BASOS: 1 %
Basophils Absolute: 0 10*3/uL (ref 0.0–0.2)
EOS (ABSOLUTE): 0.1 10*3/uL (ref 0.0–0.4)
Eos: 1 %
HEMATOCRIT: 41.6 % (ref 34.0–46.6)
HEMOGLOBIN: 14.1 g/dL (ref 11.1–15.9)
IMMATURE GRANULOCYTES: 0 %
Immature Grans (Abs): 0 10*3/uL (ref 0.0–0.1)
LYMPHS ABS: 1.5 10*3/uL (ref 0.7–3.1)
Lymphs: 24 %
MCH: 29.9 pg (ref 26.6–33.0)
MCHC: 33.9 g/dL (ref 31.5–35.7)
MCV: 88 fL (ref 79–97)
MONOS ABS: 0.5 10*3/uL (ref 0.1–0.9)
Monocytes: 8 %
NEUTROS ABS: 4.1 10*3/uL (ref 1.4–7.0)
NEUTROS PCT: 66 %
Platelets: 204 10*3/uL (ref 150–379)
RBC: 4.71 x10E6/uL (ref 3.77–5.28)
RDW: 13.5 % (ref 12.3–15.4)
WBC: 6.2 10*3/uL (ref 3.4–10.8)

## 2016-10-28 LAB — CMP14+EGFR
A/G RATIO: 1.7 (ref 1.2–2.2)
ALT: 34 IU/L — ABNORMAL HIGH (ref 0–32)
AST: 28 IU/L (ref 0–40)
Albumin: 4.5 g/dL (ref 3.5–5.5)
Alkaline Phosphatase: 73 IU/L (ref 39–117)
BILIRUBIN TOTAL: 0.4 mg/dL (ref 0.0–1.2)
BUN / CREAT RATIO: 10 (ref 9–23)
BUN: 9 mg/dL (ref 6–20)
CALCIUM: 8.9 mg/dL (ref 8.7–10.2)
CHLORIDE: 104 mmol/L (ref 96–106)
CO2: 23 mmol/L (ref 20–29)
Creatinine, Ser: 0.92 mg/dL (ref 0.57–1.00)
GFR, EST AFRICAN AMERICAN: 97 mL/min/{1.73_m2} (ref >59)
GFR, EST NON AFRICAN AMERICAN: 84 mL/min/{1.73_m2} (ref >59)
GLOBULIN, TOTAL: 2.6 g/dL (ref 1.5–4.5)
Glucose: 90 mg/dL (ref 65–99)
POTASSIUM: 4 mmol/L (ref 3.5–5.2)
Sodium: 138 mmol/L (ref 134–144)
TOTAL PROTEIN: 7.1 g/dL (ref 6.0–8.5)

## 2016-10-28 LAB — THYROID PANEL WITH TSH
FREE THYROXINE INDEX: 1.4 (ref 1.2–4.9)
T3 Uptake Ratio: 23 % — ABNORMAL LOW (ref 24–39)
T4 TOTAL: 6.3 ug/dL (ref 4.5–12.0)
TSH: 1.23 u[IU]/mL (ref 0.450–4.500)

## 2016-12-28 DIAGNOSIS — Z6830 Body mass index (BMI) 30.0-30.9, adult: Secondary | ICD-10-CM | POA: Diagnosis not present

## 2016-12-28 DIAGNOSIS — Z01419 Encounter for gynecological examination (general) (routine) without abnormal findings: Secondary | ICD-10-CM | POA: Diagnosis not present

## 2016-12-28 DIAGNOSIS — L900 Lichen sclerosus et atrophicus: Secondary | ICD-10-CM | POA: Diagnosis not present

## 2017-01-25 ENCOUNTER — Encounter: Payer: 59 | Admitting: Family Medicine

## 2017-04-11 ENCOUNTER — Other Ambulatory Visit: Payer: Self-pay | Admitting: Nurse Practitioner

## 2017-04-11 DIAGNOSIS — Z6831 Body mass index (BMI) 31.0-31.9, adult: Secondary | ICD-10-CM

## 2018-01-15 ENCOUNTER — Encounter: Payer: Self-pay | Admitting: Nurse Practitioner

## 2018-01-15 ENCOUNTER — Ambulatory Visit (INDEPENDENT_AMBULATORY_CARE_PROVIDER_SITE_OTHER): Payer: 59 | Admitting: Nurse Practitioner

## 2018-01-15 VITALS — BP 119/85 | HR 77 | Temp 97.0°F | Ht 71.0 in | Wt 217.0 lb

## 2018-01-15 DIAGNOSIS — Z0000 Encounter for general adult medical examination without abnormal findings: Secondary | ICD-10-CM | POA: Diagnosis not present

## 2018-01-15 DIAGNOSIS — Z6831 Body mass index (BMI) 31.0-31.9, adult: Secondary | ICD-10-CM | POA: Diagnosis not present

## 2018-01-15 DIAGNOSIS — Z8261 Family history of arthritis: Secondary | ICD-10-CM | POA: Diagnosis not present

## 2018-01-15 MED ORDER — PHENTERMINE HCL 37.5 MG PO TABS: 37.5000 mg | ORAL_TABLET | Freq: Every day | ORAL | 2 refills | Status: DC

## 2018-01-15 NOTE — Addendum Note (Signed)
Addended by: Bennie PieriniMARTIN, MARY-MARGARET on: 01/15/2018 01:53 PM   Modules accepted: Orders

## 2018-01-15 NOTE — Progress Notes (Signed)
   Subjective:    Patient ID: Alyssa Cordova, female    DOB: 07-18-1985, 32 y.o.   MRN: [id number]   Chief Complaint: annual physical exam  HPI:  1. Annual physical exam  Has pap done at [facility] office. Had one last year and was normal  2. BMI 31.0-31.9,adult  Weight fluctuates up and down    Outpatient Encounter Medications as of 01/15/2018  Medication Sig  . norethindrone (ORTHO MICRONOR) 0.35 MG tablet Take 1 tablet (0.35 mg total) by mouth daily. (Patient taking differently: Take 1 tablet by mouth every evening. )  . phentermine (ADIPEX-P) 37.5 MG tablet TAKE 1 TABLET BY MOUTH DAILY BEFORE BREAKFAST      New complaints: Scattered joint pain- has mom and aunt have RA  Social history: Is nurse practitioner at [employer]   Review of Systems  Constitutional: Negative for activity change and appetite change.  HENT: Negative.   Eyes: Negative for pain.  Respiratory: Negative for shortness of breath.   Cardiovascular: Negative for chest pain, palpitations and leg swelling.  Gastrointestinal: Negative for abdominal pain.  Endocrine: Negative for polydipsia.  Genitourinary: Negative.   Skin: Negative for rash.  Neurological: Negative for dizziness, weakness and headaches.  Hematological: Does not bruise/bleed easily.  Psychiatric/Behavioral: Negative.   All other systems reviewed and are negative.      Objective:   Physical Exam  Constitutional: She is oriented to person, place, and time. She appears well-developed and well-nourished. No distress.  HENT:  Head: Normocephalic.  Nose: Nose normal.  Mouth/Throat: Oropharynx is clear and moist.  Eyes: Pupils are equal, round, and reactive to light. EOM are normal.  Neck: Normal range of motion. Neck supple. No JVD present. Carotid bruit is not present.  Cardiovascular: Normal rate, regular rhythm, normal heart sounds and intact distal pulses.  Pulmonary/Chest: Effort normal and breath sounds normal. No respiratory distress. She  has no wheezes. She has no rales. She exhibits no tenderness.  Abdominal: Soft. Normal appearance, normal aorta and bowel sounds are normal. She exhibits no distension, no abdominal bruit, no pulsatile midline mass and no mass. There is no splenomegaly or hepatomegaly. There is no tenderness.  Musculoskeletal: Normal range of motion. She exhibits no edema.  Lymphadenopathy:    She has no cervical adenopathy.  Neurological: She is alert and oriented to person, place, and time. She has normal reflexes.  Skin: Skin is warm and dry.  Psychiatric: She has a normal mood and affect. Her behavior is normal. Judgment and thought content normal.  Nursing note and vitals reviewed.  BP 119/85   Pulse 77   Temp (!) 97 F (36.1 C) (Oral)   Ht _0  (1.803 m)   Wt 217 lb (98.4 kg)   BMI 30.27 kg/m       Assessment & Plan:  Alyssa Cordova comes in today with chief complaint of Annual Exam   Diagnosis and orders addressed:  1. Annual physical exam - CBC with Differential/Platelet - CMP14+EGFR - Lipid panel - Thyroid Panel With TSH - VITAMIN D 25 Hydroxy (Vit-D Deficiency, Fractures)  2. BMI 31.0-31.9,adult Discussed diet and exercise for person with BMI >25 Will recheck weight in 3-6 months  3. Family history of rheumatoid arthritis - Arthritis Panel   Labs pending Health Maintenance reviewed Diet and exercise encouraged  Follow up plan: 1 year   [place], FNP

## 2018-01-15 NOTE — Patient Instructions (Signed)

## 2018-01-16 LAB — ARTHRITIS PANEL
BASOS: 1 %
Basophils Absolute: 0 10*3/uL (ref 0.0–0.2)
EOS (ABSOLUTE): 0.1 10*3/uL (ref 0.0–0.4)
EOS: 1 %
HEMOGLOBIN: 13.7 g/dL (ref 11.1–15.9)
Hematocrit: 40.5 % (ref 34.0–46.6)
IMMATURE GRANS (ABS): 0 10*3/uL (ref 0.0–0.1)
IMMATURE GRANULOCYTES: 0 %
LYMPHS: 23 %
Lymphocytes Absolute: 1.8 10*3/uL (ref 0.7–3.1)
MCH: 29.8 pg (ref 26.6–33.0)
MCHC: 33.8 g/dL (ref 31.5–35.7)
MCV: 88 fL (ref 79–97)
MONOCYTES: 8 %
Monocytes Absolute: 0.6 10*3/uL (ref 0.1–0.9)
NEUTROS ABS: 5.3 10*3/uL (ref 1.4–7.0)
NEUTROS PCT: 67 %
Platelets: 225 10*3/uL (ref 150–450)
RBC: 4.6 x10E6/uL (ref 3.77–5.28)
RDW: 12.8 % (ref 12.3–15.4)
Rhuematoid fact SerPl-aCnc: <10 IU/mL (ref 0.0–13.9)
SED RATE: 12 mm/h (ref 0–32)
Uric Acid: 4.8 mg/dL (ref 2.5–7.1)
WBC: 7.8 10*3/uL (ref 3.4–10.8)

## 2018-01-16 LAB — CMP14+EGFR
A/G RATIO: 1.8 (ref 1.2–2.2)
ALT: 19 IU/L (ref 0–32)
AST: 17 IU/L (ref 0–40)
Albumin: 4.5 g/dL (ref 3.5–5.5)
Alkaline Phosphatase: 60 IU/L (ref 39–117)
BILIRUBIN TOTAL: 0.3 mg/dL (ref 0.0–1.2)
BUN/Creatinine Ratio: 9 (ref 9–23)
BUN: 8 mg/dL (ref 6–20)
CHLORIDE: 103 mmol/L (ref 96–106)
CO2: 23 mmol/L (ref 20–29)
Calcium: 9.2 mg/dL (ref 8.7–10.2)
Creatinine, Ser: 0.9 mg/dL (ref 0.57–1.00)
GFR, EST AFRICAN AMERICAN: 99 mL/min/{1.73_m2} (ref >59)
GFR, EST NON AFRICAN AMERICAN: 85 mL/min/{1.73_m2} (ref >59)
GLOBULIN, TOTAL: 2.5 g/dL (ref 1.5–4.5)
Glucose: 91 mg/dL (ref 65–99)
POTASSIUM: 4.5 mmol/L (ref 3.5–5.2)
SODIUM: 140 mmol/L (ref 134–144)
Total Protein: 7 g/dL (ref 6.0–8.5)

## 2018-01-16 LAB — LIPID PANEL
CHOL/HDL RATIO: 3 ratio (ref 0.0–4.4)
CHOLESTEROL TOTAL: 181 mg/dL (ref 100–199)
HDL: 60 mg/dL (ref >39)
LDL CALC: 108 mg/dL — AB (ref 0–99)
Triglycerides: 64 mg/dL (ref 0–149)
VLDL Cholesterol Cal: 13 mg/dL (ref 5–40)

## 2018-01-16 LAB — THYROID PANEL WITH TSH
Free Thyroxine Index: 1.7 (ref 1.2–4.9)
T3 UPTAKE RATIO: 24 % (ref 24–39)
T4 TOTAL: 7.2 ug/dL (ref 4.5–12.0)
TSH: 0.7 u[IU]/mL (ref 0.450–4.500)

## 2018-01-16 LAB — VITAMIN D 25 HYDROXY (VIT D DEFICIENCY, FRACTURES): Vit D, 25-Hydroxy: 22.4 ng/mL — ABNORMAL LOW (ref 30.0–100.0)

## 2018-01-31 DIAGNOSIS — Z01419 Encounter for gynecological examination (general) (routine) without abnormal findings: Secondary | ICD-10-CM | POA: Diagnosis not present

## 2018-01-31 DIAGNOSIS — Z6830 Body mass index (BMI) 30.0-30.9, adult: Secondary | ICD-10-CM | POA: Diagnosis not present

## 2018-03-14 DIAGNOSIS — Z8041 Family history of malignant neoplasm of ovary: Secondary | ICD-10-CM | POA: Diagnosis not present

## 2018-03-14 DIAGNOSIS — Z800 Family history of malignant neoplasm of digestive organs: Secondary | ICD-10-CM | POA: Diagnosis not present

## 2018-06-05 ENCOUNTER — Other Ambulatory Visit: Payer: 59

## 2018-06-05 DIAGNOSIS — R6889 Other general symptoms and signs: Secondary | ICD-10-CM

## 2018-06-06 LAB — VERITOR FLU A/B WAIVED
Influenza A: NEGATIVE
Influenza B: POSITIVE — AB

## 2018-07-12 DIAGNOSIS — J020 Streptococcal pharyngitis: Secondary | ICD-10-CM | POA: Diagnosis not present

## 2019-02-01 ENCOUNTER — Encounter: Payer: Self-pay | Admitting: Nurse Practitioner

## 2019-02-01 ENCOUNTER — Other Ambulatory Visit: Payer: Self-pay

## 2019-02-01 ENCOUNTER — Ambulatory Visit (INDEPENDENT_AMBULATORY_CARE_PROVIDER_SITE_OTHER): Payer: 59 | Admitting: Nurse Practitioner

## 2019-02-01 VITALS — BP 121/82 | HR 80 | Temp 98.2°F | Resp 16 | Ht 70.0 in | Wt 215.0 lb

## 2019-02-01 DIAGNOSIS — Z6830 Body mass index (BMI) 30.0-30.9, adult: Secondary | ICD-10-CM | POA: Diagnosis not present

## 2019-02-01 DIAGNOSIS — Z0000 Encounter for general adult medical examination without abnormal findings: Secondary | ICD-10-CM | POA: Diagnosis not present

## 2019-02-01 NOTE — Patient Instructions (Signed)
Exercising to Stay Healthy To become healthy and stay healthy, it is recommended that you do moderate-intensity and vigorous-intensity exercise. You can tell that you are exercising at a moderate intensity if your heart starts beating faster and you start breathing faster but can still hold a conversation. You can tell that you are exercising at a vigorous intensity if you are breathing much harder and faster and cannot hold a conversation while exercising. Exercising regularly is important. It has many health benefits, such as:  Improving overall fitness, flexibility, and endurance.  Increasing bone density.  Helping with weight control.  Decreasing body fat.  Increasing muscle strength.  Reducing stress and tension.  Improving overall health. How often should I exercise? Choose an activity that you enjoy, and set realistic goals. Your health care provider can help you make an activity plan that works for you. Exercise regularly as told by your health care provider. This may include:  Doing strength training two times a week, such as: ? Lifting weights. ? Using resistance bands. ? Push-ups. ? Sit-ups. ? Yoga.  Doing a certain intensity of exercise for a given amount of time. Choose from these options: ? A total of 150 minutes of moderate-intensity exercise every week. ? A total of 75 minutes of vigorous-intensity exercise every week. ? A mix of moderate-intensity and vigorous-intensity exercise every week. Children, pregnant women, people who have not exercised regularly, people who are overweight, and older adults may need to talk with a health care provider about what activities are safe to do. If you have a medical condition, be sure to talk with your health care provider before you start a new exercise program. What are some exercise ideas? Moderate-intensity exercise ideas include:  Walking 1 mile (1.6 km) in about 15 minutes.  Biking.  Hiking.  Golfing.  Dancing.   Water aerobics. Vigorous-intensity exercise ideas include:  Walking 4.5 miles (7.2 km) or more in about 1 hour.  Jogging or running 5 miles (8 km) in about 1 hour.  Biking 10 miles (16.1 km) or more in about 1 hour.  Lap swimming.  Roller-skating or in-line skating.  Cross-country skiing.  Vigorous competitive sports, such as football, basketball, and soccer.  Jumping rope.  Aerobic dancing. What are some everyday activities that can help me to get exercise?  Yard work, such as: ? Pushing a lawn mower. ? Raking and bagging leaves.  Washing your car.  Pushing a stroller.  Shoveling snow.  Gardening.  Washing windows or floors. How can I be more active in my day-to-day activities?  Use stairs instead of an elevator.  Take a walk during your lunch break.  If you drive, park your car farther away from your work or school.  If you take public transportation, get off one stop early and walk the rest of the way.  Stand up or walk around during all of your indoor phone calls.  Get up, stretch, and walk around every 30 minutes throughout the day.  Enjoy exercise with a friend. Support to continue exercising will help you keep a regular routine of activity. What guidelines can I follow while exercising?  Before you start a new exercise program, talk with your health care provider.  Do not exercise so much that you hurt yourself, feel dizzy, or get very short of breath.  Wear comfortable clothes and wear shoes with good support.  Drink plenty of water while you exercise to prevent dehydration or heat stroke.  Work out until your breathing   and your heartbeat get faster. Where to find more information  [place] Department of Health and Human Services: www.hhs.gov  Centers for Disease Control and Prevention (CDC): www.cdc.gov Summary  Exercising regularly is important. It will improve your overall fitness, flexibility, and endurance.  Regular exercise also will  improve your overall health. It can help you control your weight, reduce stress, and improve your bone density.  Do not exercise so much that you hurt yourself, feel dizzy, or get very short of breath.  Before you start a new exercise program, talk with your health care provider. This information is not intended to replace advice given to you by your health care provider. Make sure you discuss any questions you have with your health care provider. Document Released: 05/21/2010 Document Revised: 03/31/2017 Document Reviewed: 03/09/2017 Elsevier Patient Education  2020 Elsevier Inc.  

## 2019-02-01 NOTE — Progress Notes (Signed)
Subjective:    Patient ID: Alyssa Cordova, female    DOB: 1985/11/23, 33 y.o.   MRN: [id number]   Chief Complaint: Annual Exam    HPI:  1. Annual physical exam No complaints today. Gets PAP from GYN office- had one 2 years ago..   2. BMI 30.0-30.9 No recent weight changes Wt Readings from Last 3 Encounters:  02/01/19 215 lb (97.5 kg)  01/15/18 217 lb (98.4 kg)  10/27/16 223 lb 6.4 oz (101.3 kg)   BMI Readings from Last 3 Encounters:  02/01/19 30.85 kg/m  01/15/18 30.27 kg/m  10/27/16 31.16 kg/m     Outpatient Encounter Medications as of 02/01/2019  Medication Sig  . levonorgestrel (MIRENA) 20 MCG/24HR IUD 1 each by Intrauterine route once.  . phentermine (ADIPEX-P) 37.5 MG tablet Take 1 tablet (37.5 mg total) by mouth daily before breakfast. (Patient not taking: Reported on 02/01/2019)     Past Surgical History:  Procedure Laterality Date  . EVALUATION UNDER ANESTHESIA WITH ANAL FISTULECTOMY N/A 08/27/2015   Procedure: EXAM UNDER ANESTHESIA WITH ANAL FISTULATOMY;  Surgeon: Jackolyn Confer, MD;  Location: [facility];  Service: General;  Laterality: N/A;  . WISDOM TOOTH EXTRACTION  age 78    Family History  Problem Relation Age of Onset  . Heart disease Maternal Grandfather   . Heart attack Maternal Grandfather   . Asthma Mother   . Hypertension Mother   . Heart disease Mother   . Diabetes Mother   . Kidney disease Mother   . Cancer Maternal Grandmother        colon  . Cancer Paternal Grandmother   . 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  HENT: Negative.   Eyes: Negative for pain.  Respiratory: Negative for shortness of breath.   Cardiovascular: Negative for chest pain, palpitations and leg swelling.  Gastrointestinal: Negative for  abdominal pain.  Endocrine: Negative for polydipsia.  Genitourinary: Negative.   Skin: Negative for rash.  Neurological: Negative for dizziness, weakness and headaches.  Hematological: Does not bruise/bleed easily.  Psychiatric/Behavioral: Negative.   All other systems reviewed and are negative.      Objective:   Physical Exam Vitals signs and nursing note reviewed.  Constitutional:      General: She is not in acute distress.    Appearance: Normal appearance. She is well-developed.  HENT:     Head: Normocephalic.     Nose: Nose normal.  Eyes:     Pupils: Pupils are equal, round, and reactive to light.  Neck:     Musculoskeletal: Normal range of motion and neck supple.     Vascular: No carotid bruit or JVD.  Cardiovascular:     Rate and Rhythm: Normal rate and regular rhythm.     Heart sounds: Normal heart sounds.  Pulmonary:     Effort: Pulmonary effort is normal. No respiratory distress.     Breath sounds: Normal breath sounds. No wheezing or rales.  Chest:     Chest wall: No tenderness.  Abdominal:     General: Bowel sounds are normal. There is no distension or abdominal bruit.     Palpations: Abdomen is soft. There is no hepatomegaly, splenomegaly, mass or pulsatile mass.     Tenderness: There is no abdominal tenderness.  Musculoskeletal: Normal  range of motion.  Lymphadenopathy:     Cervical: No cervical adenopathy.  Skin:    General: Skin is warm and dry.  Neurological:     Mental Status: She is alert and oriented to person, place, and time.     Deep Tendon Reflexes: Reflexes are normal and symmetric.  Psychiatric:        Behavior: Behavior normal.        Thought Content: Thought content normal.        Judgment: Judgment normal.      BP 121/82   Pulse 80   Temp 98.2 F (36.8 C) (Temporal)   Resp 16   Ht 5' 10"  (1.778 m)   Wt 215 lb (97.5 kg)   SpO2 98%   BMI 30.85 kg/m       Assessment & Plan:  Alyssa Cordova in today with chief complaint of  Annual Exam   1. Annual physical exam Labs oending - CBC with Differential/Platelet - CMP14+EGFR - Lipid panel - Thyroid Panel With TSH  2. BMI 30.0-30.9,adult Diet and exercise encouraged  Mary-Margaret Hassell Done, FNP

## 2019-02-02 LAB — CBC WITH DIFFERENTIAL/PLATELET
Basophils Absolute: 0.1 10*3/uL (ref 0.0–0.2)
Basos: 1 %
EOS (ABSOLUTE): 0.1 10*3/uL (ref 0.0–0.4)
Eos: 1 %
Hematocrit: 41.5 % (ref 34.0–46.6)
Hemoglobin: 13.8 g/dL (ref 11.1–15.9)
Immature Grans (Abs): 0 10*3/uL (ref 0.0–0.1)
Immature Granulocytes: 0 %
Lymphocytes Absolute: 1.8 10*3/uL (ref 0.7–3.1)
Lymphs: 23 %
MCH: 28.7 pg (ref 26.6–33.0)
MCHC: 33.3 g/dL (ref 31.5–35.7)
MCV: 86 fL (ref 79–97)
Monocytes Absolute: 0.6 10*3/uL (ref 0.1–0.9)
Monocytes: 7 %
Neutrophils Absolute: 5.4 10*3/uL (ref 1.4–7.0)
Neutrophils: 68 %
Platelets: 277 10*3/uL (ref 150–450)
RBC: 4.81 x10E6/uL (ref 3.77–5.28)
RDW: 12.2 % (ref 11.7–15.4)
WBC: 7.9 10*3/uL (ref 3.4–10.8)

## 2019-02-02 LAB — LIPID PANEL
Chol/HDL Ratio: 3.1 ratio (ref 0.0–4.4)
Cholesterol, Total: 182 mg/dL (ref 100–199)
HDL: 58 mg/dL (ref >39)
LDL Chol Calc (NIH): 112 mg/dL — ABNORMAL HIGH (ref 0–99)
Triglycerides: 61 mg/dL (ref 0–149)
VLDL Cholesterol Cal: 12 mg/dL (ref 5–40)

## 2019-02-02 LAB — CMP14+EGFR
ALT: 38 IU/L — ABNORMAL HIGH (ref 0–32)
AST: 35 IU/L (ref 0–40)
Albumin/Globulin Ratio: 1.4 (ref 1.2–2.2)
Albumin: 4.4 g/dL (ref 3.8–4.8)
Alkaline Phosphatase: 79 IU/L (ref 39–117)
BUN/Creatinine Ratio: 9 (ref 9–23)
BUN: 7 mg/dL (ref 6–20)
Bilirubin Total: 0.3 mg/dL (ref 0.0–1.2)
CO2: 22 mmol/L (ref 20–29)
Calcium: 9.3 mg/dL (ref 8.7–10.2)
Chloride: 103 mmol/L (ref 96–106)
Creatinine, Ser: 0.78 mg/dL (ref 0.57–1.00)
GFR calc Af Amer: 116 mL/min/{1.73_m2} (ref >59)
GFR calc non Af Amer: 101 mL/min/{1.73_m2} (ref >59)
Globulin, Total: 3.1 g/dL (ref 1.5–4.5)
Glucose: 83 mg/dL (ref 65–99)
Potassium: 4.7 mmol/L (ref 3.5–5.2)
Sodium: 139 mmol/L (ref 134–144)
Total Protein: 7.5 g/dL (ref 6.0–8.5)

## 2019-02-02 LAB — THYROID PANEL WITH TSH
Free Thyroxine Index: 1.7 (ref 1.2–4.9)
T3 Uptake Ratio: 23 % — ABNORMAL LOW (ref 24–39)
T4, Total: 7.2 ug/dL (ref 4.5–12.0)
TSH: 1.19 u[IU]/mL (ref 0.450–4.500)

## 2019-02-07 ENCOUNTER — Telehealth: Payer: Self-pay | Admitting: Nurse Practitioner

## 2019-02-07 NOTE — Telephone Encounter (Signed)
Test for my chart communication

## 2019-03-07 ENCOUNTER — Other Ambulatory Visit: Payer: Self-pay | Admitting: *Deleted

## 2019-03-07 DIAGNOSIS — Z20822 Contact with and (suspected) exposure to covid-19: Secondary | ICD-10-CM

## 2019-05-20 ENCOUNTER — Other Ambulatory Visit: Payer: Self-pay | Admitting: Nurse Practitioner

## 2019-05-20 DIAGNOSIS — R102 Pelvic and perineal pain: Secondary | ICD-10-CM

## 2019-05-21 ENCOUNTER — Ambulatory Visit (HOSPITAL_COMMUNITY): Payer: 59

## 2019-05-23 ENCOUNTER — Other Ambulatory Visit: Payer: Self-pay | Admitting: Nurse Practitioner

## 2019-05-23 DIAGNOSIS — R102 Pelvic and perineal pain: Secondary | ICD-10-CM

## 2019-05-24 ENCOUNTER — Other Ambulatory Visit: Payer: Self-pay

## 2019-05-24 ENCOUNTER — Ambulatory Visit (HOSPITAL_COMMUNITY)
Admission: RE | Admit: 2019-05-24 | Discharge: 2019-05-24 | Disposition: A | Discharge status: Home / Self Care | Payer: 59 | Source: Ambulatory Visit | Attending: Nurse Practitioner | Admitting: Nurse Practitioner

## 2019-05-24 DIAGNOSIS — N83291 Other ovarian cyst, right side: Secondary | ICD-10-CM | POA: Diagnosis not present

## 2019-05-24 DIAGNOSIS — R102 Pelvic and perineal pain: Secondary | ICD-10-CM | POA: Insufficient documentation

## 2019-05-28 ENCOUNTER — Ambulatory Visit (INDEPENDENT_AMBULATORY_CARE_PROVIDER_SITE_OTHER): Payer: 59

## 2019-05-28 ENCOUNTER — Other Ambulatory Visit: Payer: Self-pay | Admitting: Nurse Practitioner

## 2019-05-28 DIAGNOSIS — Z30431 Encounter for routine checking of intrauterine contraceptive device: Secondary | ICD-10-CM

## 2019-06-11 DIAGNOSIS — N83201 Unspecified ovarian cyst, right side: Secondary | ICD-10-CM | POA: Diagnosis not present

## 2019-07-17 DIAGNOSIS — Z01419 Encounter for gynecological examination (general) (routine) without abnormal findings: Secondary | ICD-10-CM | POA: Diagnosis not present

## 2019-07-17 DIAGNOSIS — N83201 Unspecified ovarian cyst, right side: Secondary | ICD-10-CM | POA: Diagnosis not present

## 2019-07-17 DIAGNOSIS — Z30432 Encounter for removal of intrauterine contraceptive device: Secondary | ICD-10-CM | POA: Diagnosis not present

## 2019-07-17 DIAGNOSIS — Z6833 Body mass index (BMI) 33.0-33.9, adult: Secondary | ICD-10-CM | POA: Diagnosis not present

## 2019-07-24 DIAGNOSIS — R1032 Left lower quadrant pain: Secondary | ICD-10-CM | POA: Diagnosis not present

## 2019-07-24 DIAGNOSIS — N83202 Unspecified ovarian cyst, left side: Secondary | ICD-10-CM | POA: Diagnosis not present

## 2019-07-24 DIAGNOSIS — N760 Acute vaginitis: Secondary | ICD-10-CM | POA: Diagnosis not present

## 2019-08-12 ENCOUNTER — Ambulatory Visit (INDEPENDENT_AMBULATORY_CARE_PROVIDER_SITE_OTHER): Payer: 59

## 2019-08-12 ENCOUNTER — Other Ambulatory Visit: Payer: Self-pay

## 2019-08-12 ENCOUNTER — Ambulatory Visit (HOSPITAL_COMMUNITY)
Admission: RE | Admit: 2019-08-12 | Discharge: 2019-08-12 | Disposition: A | Discharge status: Home / Self Care | Payer: 59 | Source: Ambulatory Visit | Attending: Nurse Practitioner | Admitting: Nurse Practitioner

## 2019-08-12 ENCOUNTER — Other Ambulatory Visit: Payer: Self-pay | Admitting: Family Medicine

## 2019-08-12 ENCOUNTER — Other Ambulatory Visit: Payer: Self-pay | Admitting: Nurse Practitioner

## 2019-08-12 ENCOUNTER — Ambulatory Visit (INDEPENDENT_AMBULATORY_CARE_PROVIDER_SITE_OTHER): Payer: 59 | Admitting: Nurse Practitioner

## 2019-08-12 DIAGNOSIS — N730 Acute parametritis and pelvic cellulitis: Secondary | ICD-10-CM

## 2019-08-12 DIAGNOSIS — R102 Pelvic and perineal pain: Secondary | ICD-10-CM | POA: Diagnosis not present

## 2019-08-12 DIAGNOSIS — N200 Calculus of kidney: Secondary | ICD-10-CM | POA: Diagnosis not present

## 2019-08-12 DIAGNOSIS — R109 Unspecified abdominal pain: Secondary | ICD-10-CM | POA: Diagnosis not present

## 2019-08-12 MED ORDER — METRONIDAZOLE 500 MG PO TABS: 500.0000 mg | ORAL_TABLET | Freq: Three times a day (TID) | ORAL | 0 refills | Status: AC

## 2019-08-12 MED ORDER — CEFTRIAXONE SODIUM 1 G IJ SOLR
1.0000 g | Freq: Once | INTRAMUSCULAR | Status: AC
  Administered 2019-08-12: 1 g via INTRAMUSCULAR

## 2019-08-12 MED ORDER — IOHEXOL 300 MG/ML  SOLN
100.0000 mL | Freq: Once | INTRAMUSCULAR | Status: AC | PRN
  Administered 2019-08-12: 100 mL via INTRAVENOUS

## 2019-08-12 NOTE — Progress Notes (Signed)
FLAGY; e

## 2019-08-12 NOTE — Progress Notes (Signed)
Placed KUB for right lower quadrant abdominal pain, nausea vomiting

## 2019-08-14 ENCOUNTER — Telehealth: Payer: Self-pay | Admitting: Nurse Practitioner

## 2019-08-14 NOTE — Telephone Encounter (Signed)
Still having abdominal pain and nausea and vomiting despite having to rocephin shots. Needs to go see GYN today. Husband agreed to take her this morning.

## 2019-08-22 ENCOUNTER — Other Ambulatory Visit: Payer: 59

## 2019-08-22 ENCOUNTER — Other Ambulatory Visit: Payer: Self-pay | Admitting: Nurse Practitioner

## 2019-08-22 ENCOUNTER — Other Ambulatory Visit: Payer: Self-pay

## 2019-08-22 DIAGNOSIS — N730 Acute parametritis and pelvic cellulitis: Secondary | ICD-10-CM | POA: Diagnosis not present

## 2019-08-23 LAB — CBC WITH DIFFERENTIAL/PLATELET
Basophils Absolute: 0.1 10*3/uL (ref 0.0–0.2)
Basos: 1 %
EOS (ABSOLUTE): 0.1 10*3/uL (ref 0.0–0.4)
Eos: 1 %
Hematocrit: 36.4 % (ref 34.0–46.6)
Hemoglobin: 12.7 g/dL (ref 11.1–15.9)
Immature Grans (Abs): 0.1 10*3/uL (ref 0.0–0.1)
Immature Granulocytes: 1 %
Lymphocytes Absolute: 2.1 10*3/uL (ref 0.7–3.1)
Lymphs: 23 %
MCH: 29.2 pg (ref 26.6–33.0)
MCHC: 34.9 g/dL (ref 31.5–35.7)
MCV: 84 fL (ref 79–97)
Monocytes Absolute: 0.4 10*3/uL (ref 0.1–0.9)
Monocytes: 5 %
Neutrophils Absolute: 6.5 10*3/uL (ref 1.4–7.0)
Neutrophils: 69 %
Platelets: 379 10*3/uL (ref 150–450)
RBC: 4.35 x10E6/uL (ref 3.77–5.28)
RDW: 13 % (ref 11.7–15.4)
WBC: 9.2 10*3/uL (ref 3.4–10.8)

## 2019-08-28 DIAGNOSIS — R1904 Left lower quadrant abdominal swelling, mass and lump: Secondary | ICD-10-CM | POA: Diagnosis not present

## 2019-08-28 DIAGNOSIS — N83202 Unspecified ovarian cyst, left side: Secondary | ICD-10-CM | POA: Diagnosis not present

## 2019-08-28 DIAGNOSIS — N83201 Unspecified ovarian cyst, right side: Secondary | ICD-10-CM | POA: Diagnosis not present

## 2020-03-29 IMAGING — DX DG ABDOMEN 1V
2 series · 2 of 2 positions shown · non-contrast
Comparison: None.

CLINICAL DATA: Check IUD placement

EXAM:
ABDOMEN - 1 VIEW

[abdomen kub (1 of 2)]
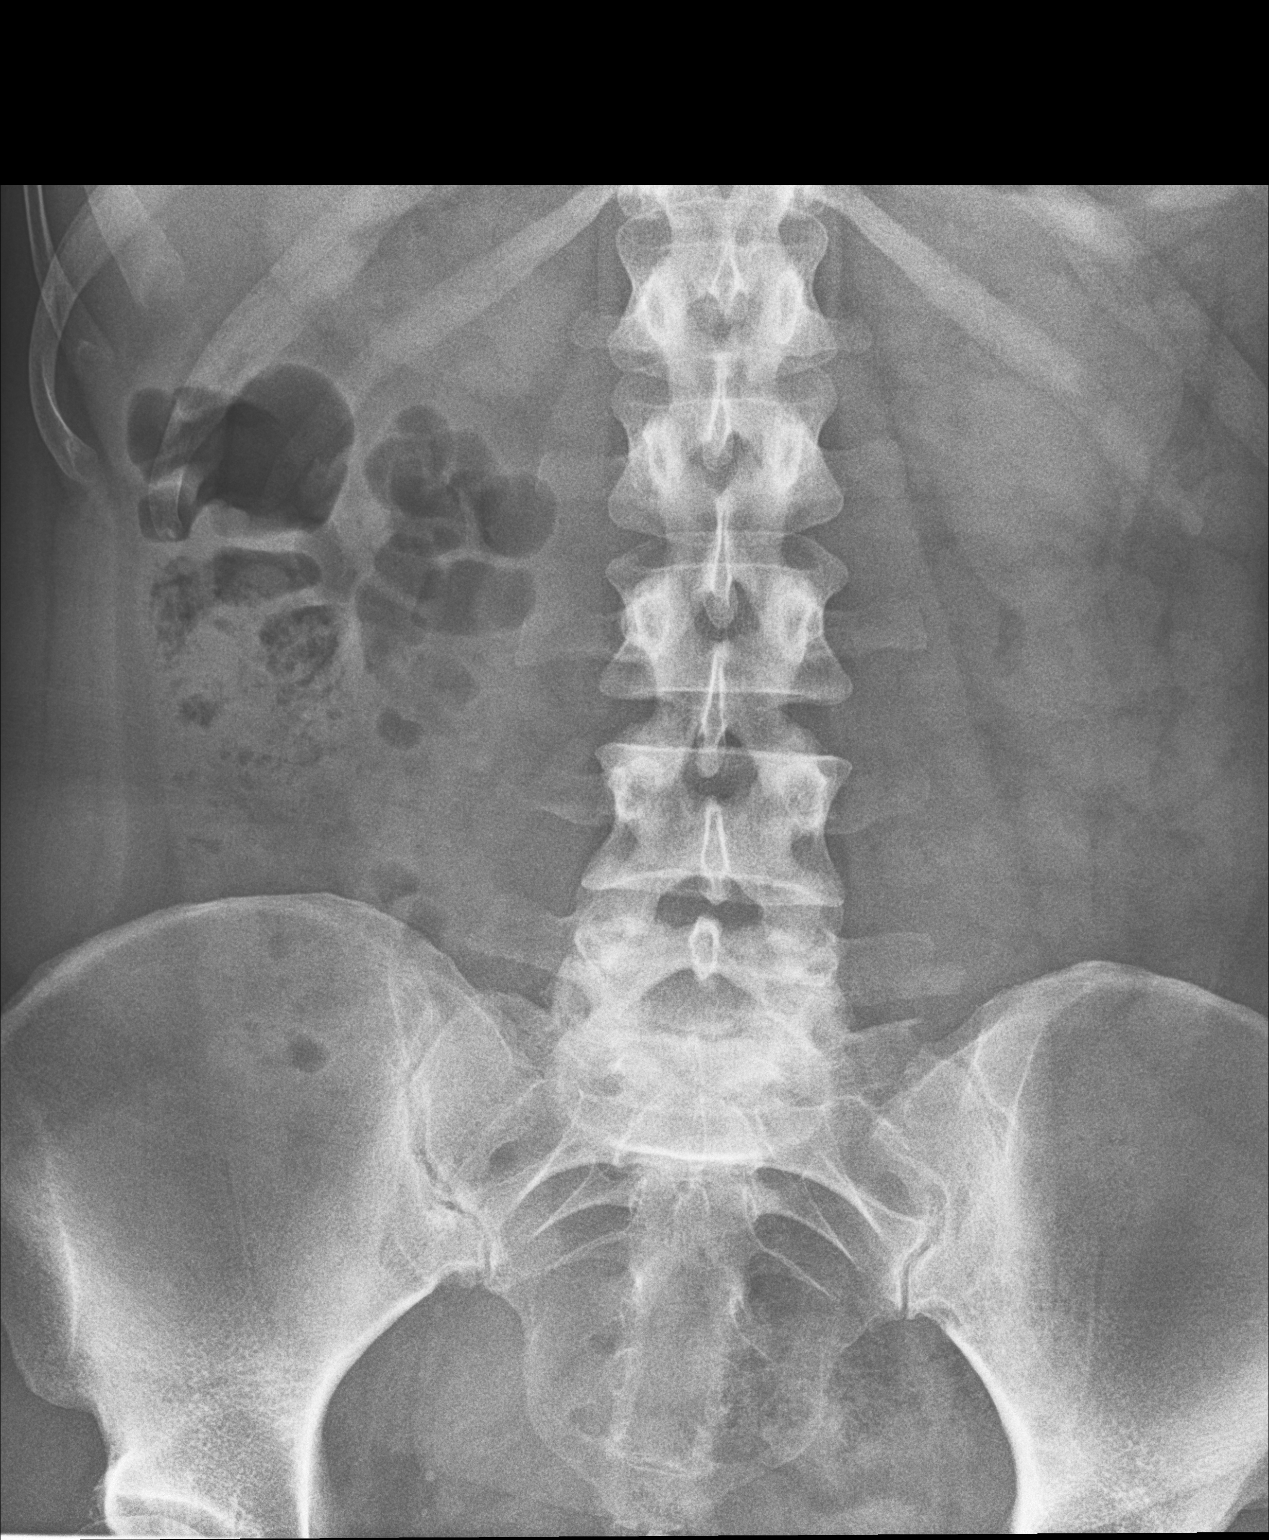

[abdomen kub (2 of 2)]
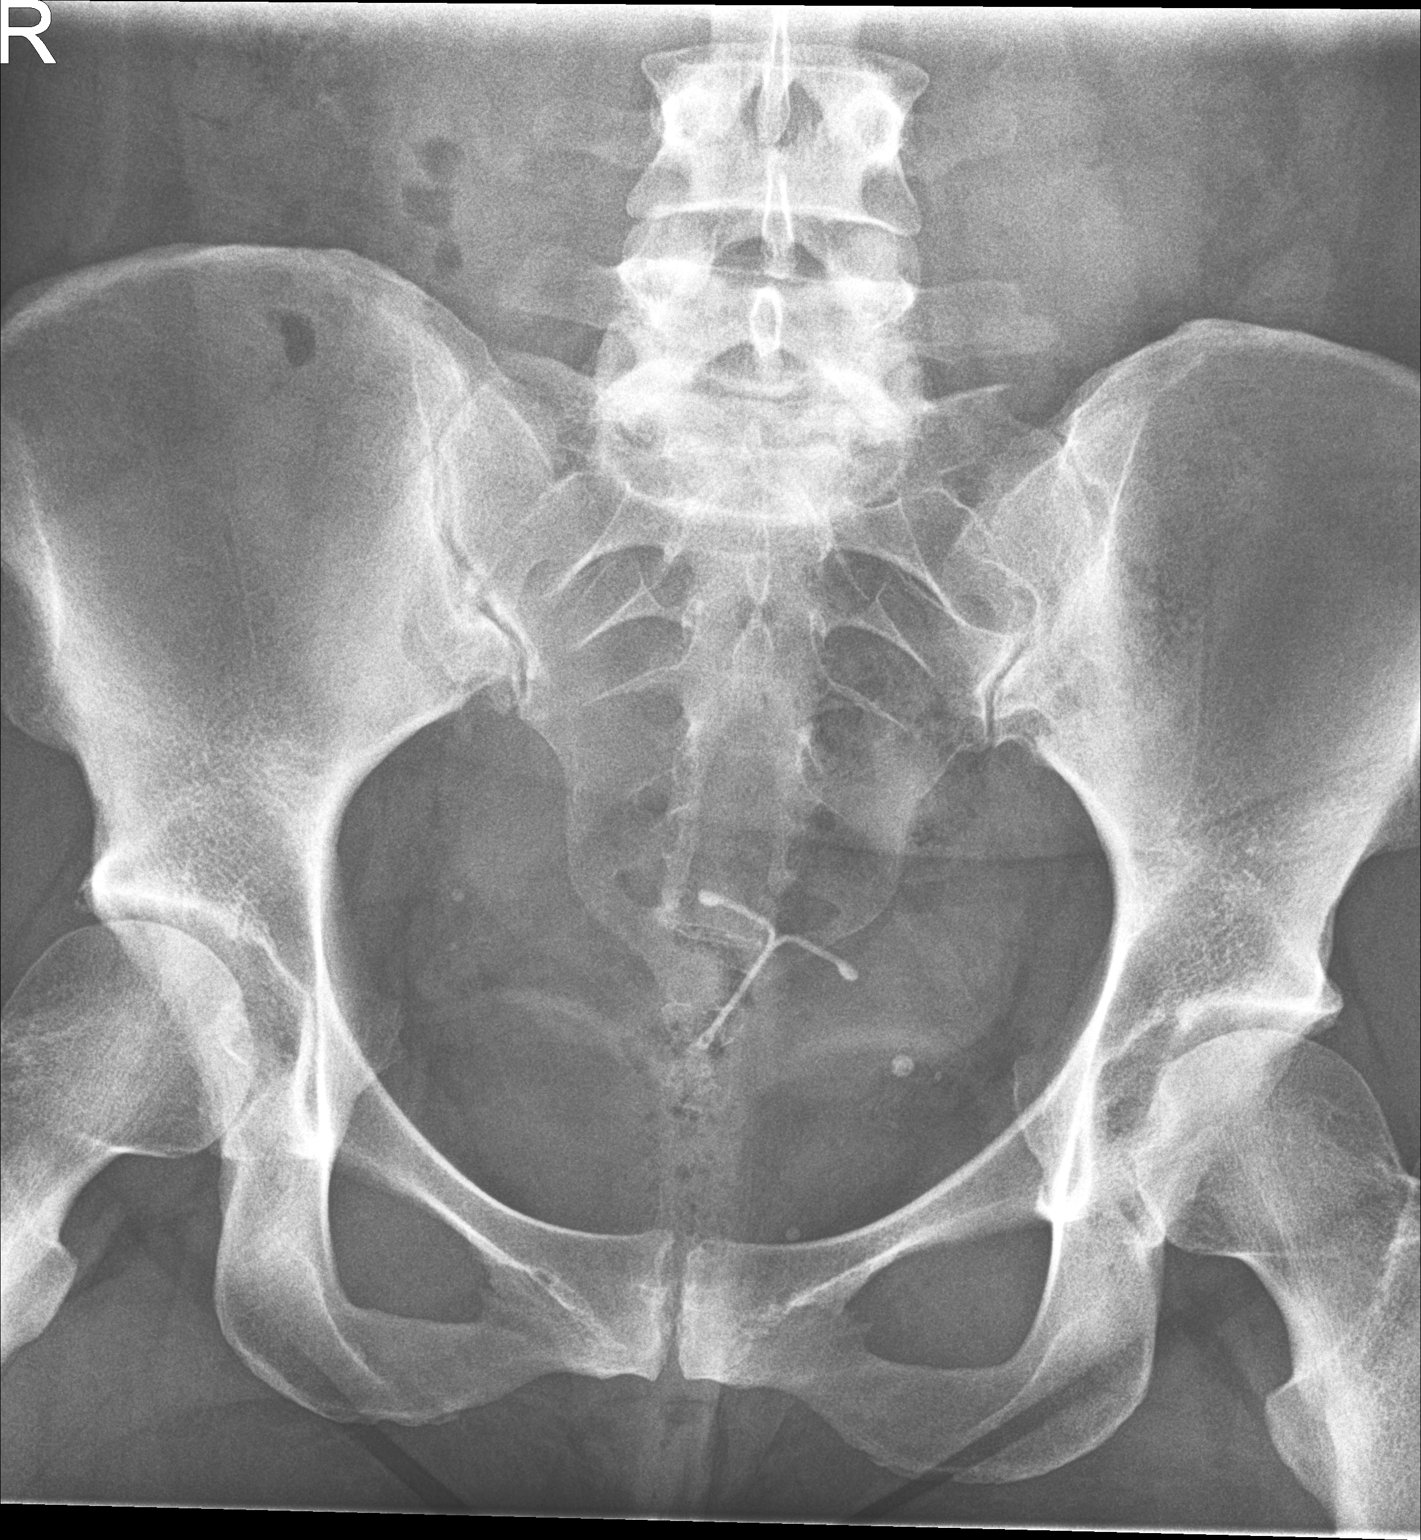

[2 of 2 positions shown; findings below may reference images not displayed]

FINDINGS: IUD overlaps the low pelvis, slightly tilted to the left but usually
not indicative of pathology. Bowel gas pattern is normal.
IMPRESSION: IUD projects over the central pelvis with mild to tilting to the
left that is usually incidental.

## 2020-04-08 DIAGNOSIS — H52221 Regular astigmatism, right eye: Secondary | ICD-10-CM | POA: Diagnosis not present

## 2020-06-13 IMAGING — CT CT ABD-PELV W/ CM
2 of 4 series · 15 of 46 positions shown, 17 images · IV contrast (Omnipaque or Isovue)
Comparison: None.

CLINICAL DATA: Pelvic pain.  Nausea, fever.

EXAM:
CT ABDOMEN AND PELVIS WITH CONTRAST
TECHNIQUE: Multidetector CT imaging of the abdomen and pelvis was performed
using the standard protocol following bolus administration of
intravenous contrast.
CONTRAST:  100mL OMNIPAQUE IOHEXOL 300 MG/ML  SOLN

[Series 2: axial st · axial · 0.78mm/px · z∈[-397,+118]mm · 12 of 117 slices shown, 14 images]
[im 7/117  soft-tissue]
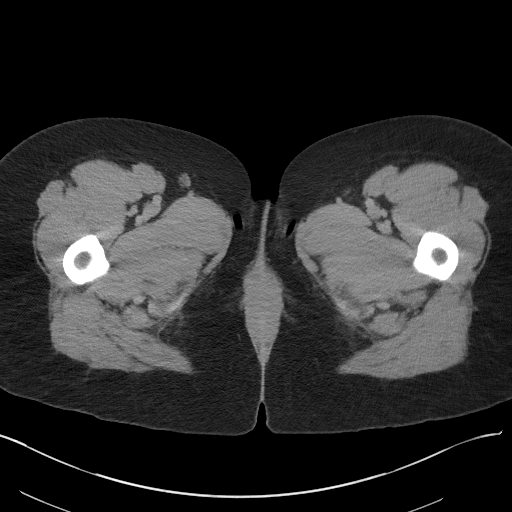
[im 7/117  bone]
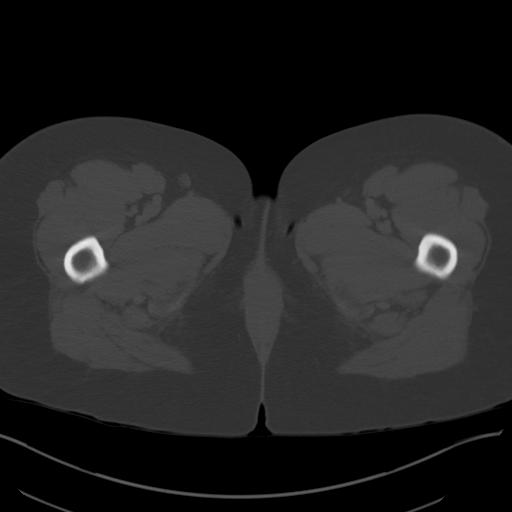
[im 21/117  soft-tissue]
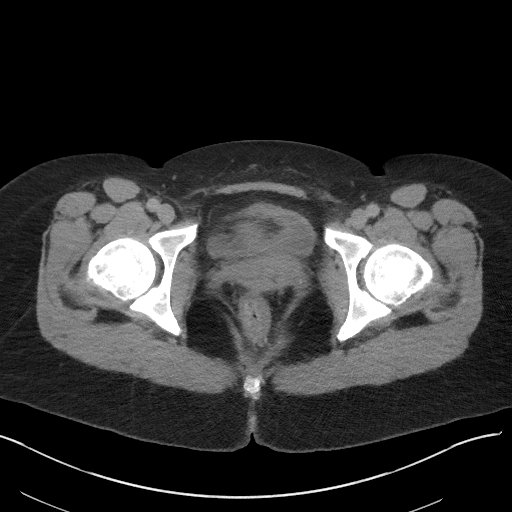
[im 28/117  soft-tissue]
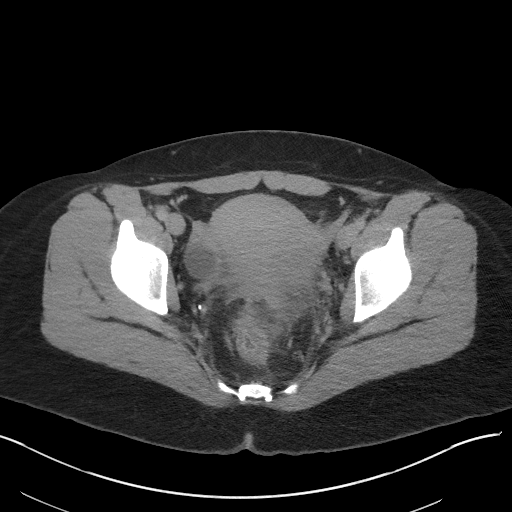
[im 35/117  soft-tissue]
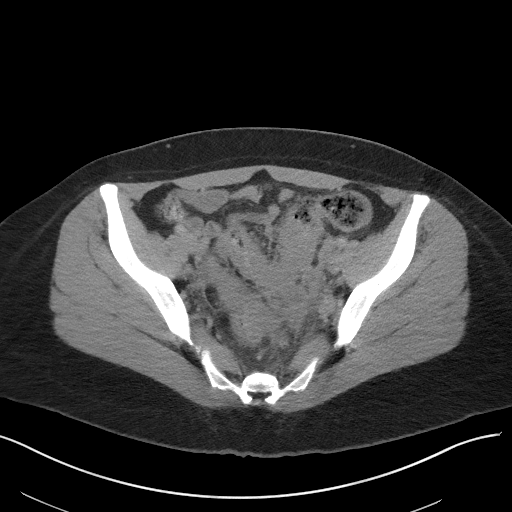
[im 48/117  soft-tissue]
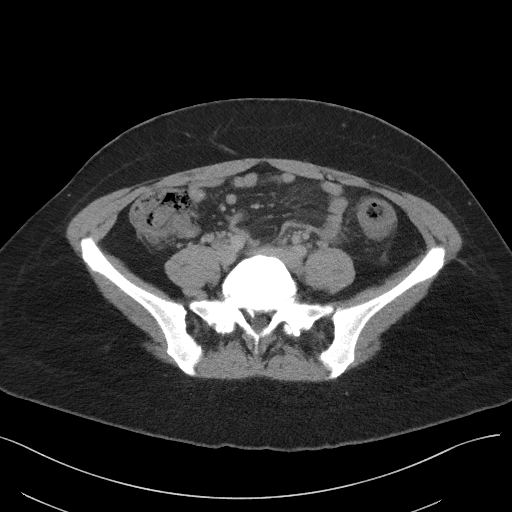
[im 55/117  soft-tissue]
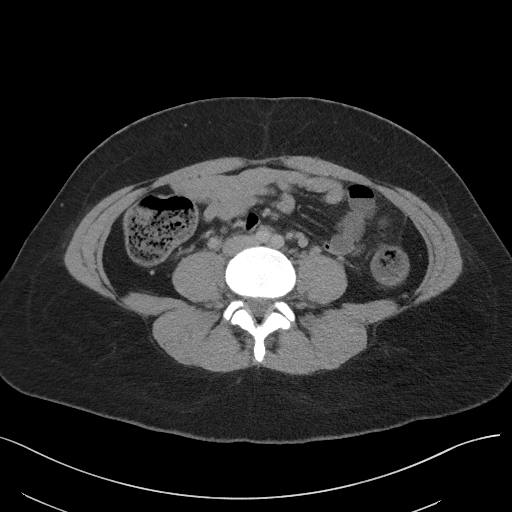
[im 62/117  soft-tissue]
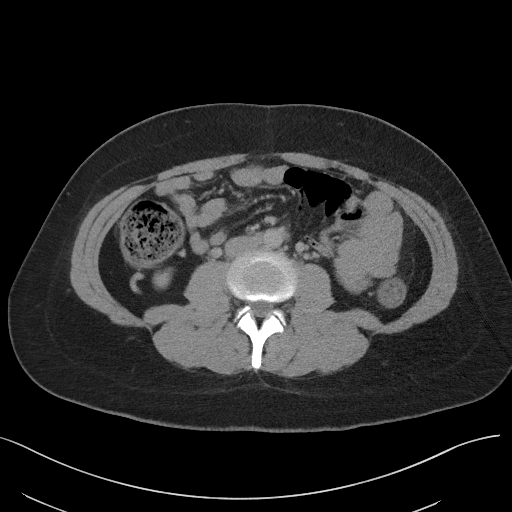
[im 76/117  soft-tissue]
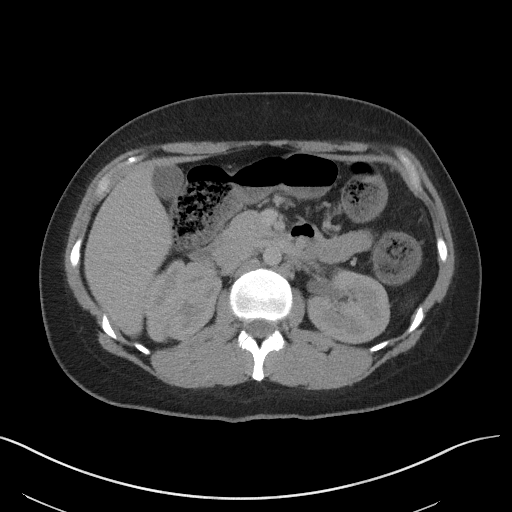
[im 82/117  soft-tissue]
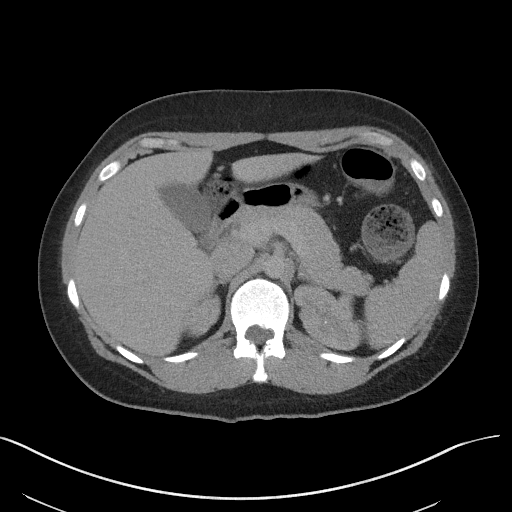
[im 82/117  bone]
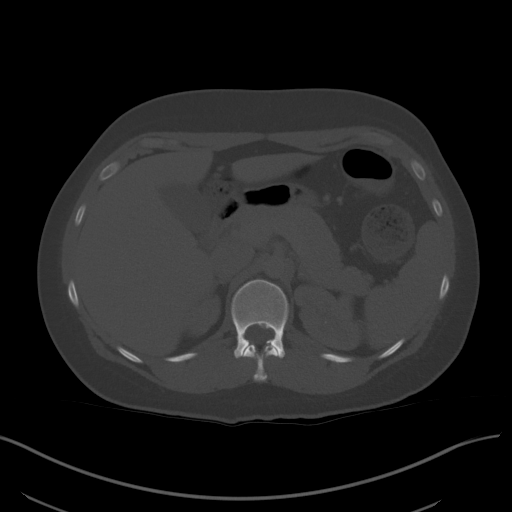
[im 89/117  soft-tissue]
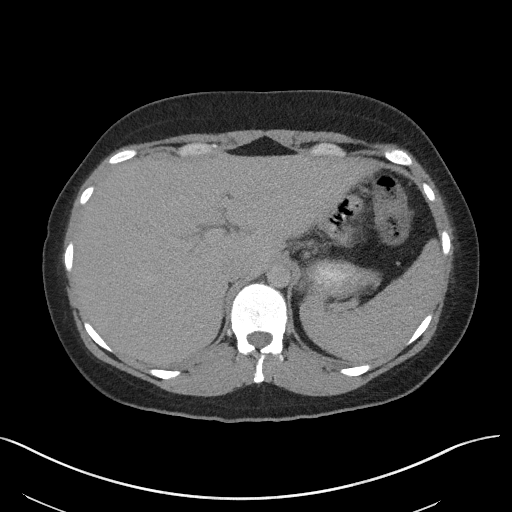
[im 103/117  soft-tissue]
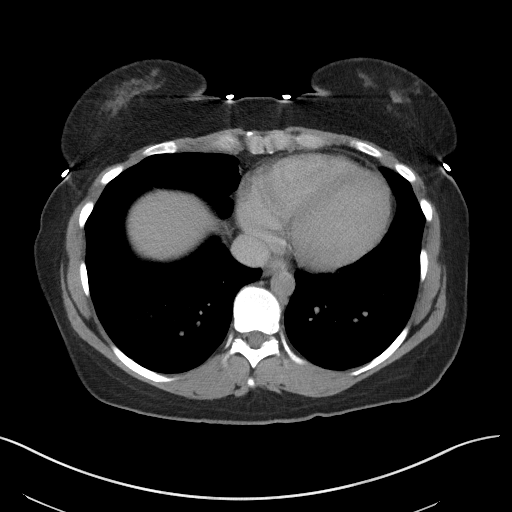
[im 110/117  soft-tissue]
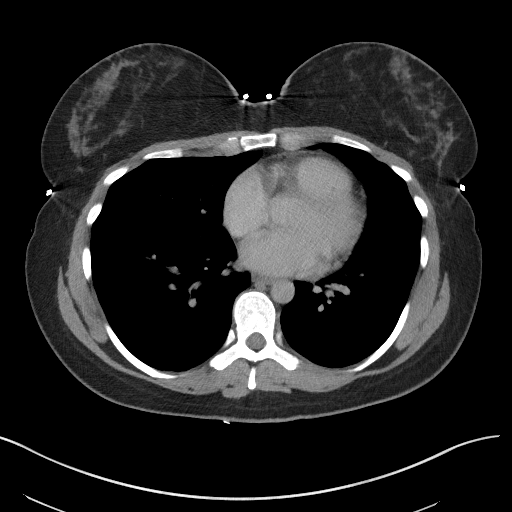

[Series 5: coronal st · coronal · 0.76mm/px · 3 of 117 slices shown]
[im 39/117  soft-tissue]
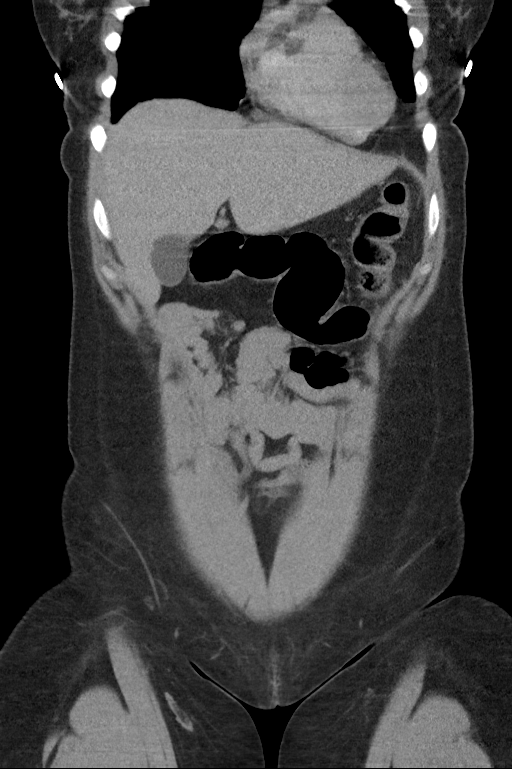
[im 52/117  soft-tissue]
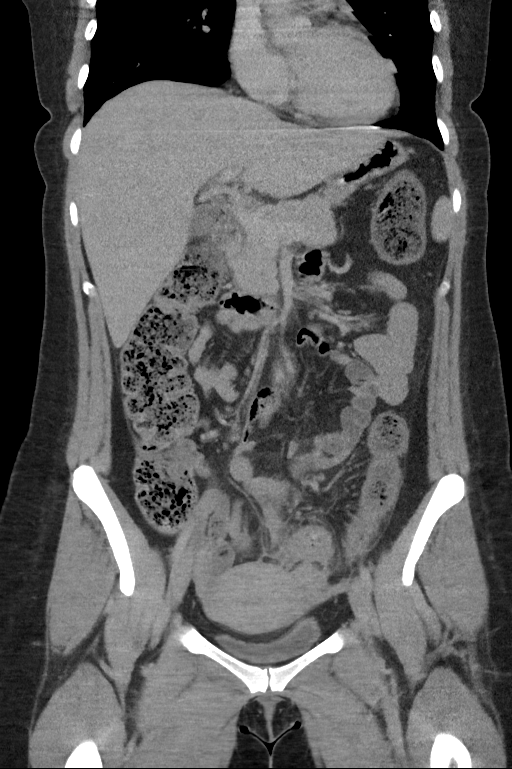
[im 65/117  soft-tissue]
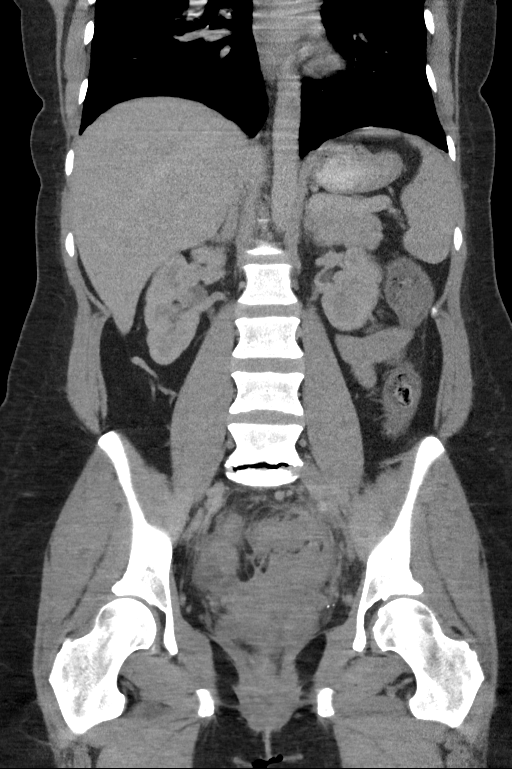

[15 of 46 positions shown; findings below may reference images not displayed]

FINDINGS: Lower chest: Lung bases are clear. No effusions. Heart is normal
size.

Hepatobiliary: No focal hepatic abnormality. Gallbladder
unremarkable.

Pancreas: No focal abnormality or ductal dilatation.

Spleen: No focal abnormality.  Normal size.

Adrenals/Urinary Tract: Punctate nonobstructing stone in the upper
pole of the left kidney. No ureteral stones or hydronephrosis.
Adrenal glands and urinary bladder unremarkable.

Stomach/Bowel: There is wall pelvic inflammatory process thickening
in the sigmoid colon with surrounding. No evidence of bowel
obstruction. Appendix is normal. Stomach and small bowel
decompressed.

Vascular/Lymphatic: No evidence of aneurysm or adenopathy.

Reproductive: Extensive inflammatory process in the left pelvis.
This appears to surround the left ovary. Fluid collection is seen
adjacent to the left ovary and sigmoid colon measuring up to 4.4 cm,
with somewhat ill-defined walls, likely developing abscess.
Inflammation also noted surrounding the sigmoid colon with wall
thickening, likely secondary. Right ovarian cyst measures 3 cm.

Other: No free fluid or free air.

Musculoskeletal: No acute bony abnormality.
IMPRESSION: Inflammatory process in the left pelvis, surrounding the left ovary
and sigmoid colon. Sigmoid colon walls also lobe diffusely
thickened. I favor this represents pelvic inflammatory
disease/tubo-ovarian abscess with secondary involvement of the
sigmoid colon. Somewhat ill-defined fluid collection adjacent to the
left ovary in the cul-de-sac measures up to 4.4 cm, likely
phlegmon/developing abscess.

Punctate left upper pole nephrolithiasis.  No hydronephrosis.

## 2020-10-27 ENCOUNTER — Ambulatory Visit: Payer: 59 | Admitting: Pharmacist

## 2020-10-27 VITALS — Wt 243.0 lb

## 2020-10-27 DIAGNOSIS — E8881 Metabolic syndrome: Secondary | ICD-10-CM

## 2020-10-29 ENCOUNTER — Ambulatory Visit (INDEPENDENT_AMBULATORY_CARE_PROVIDER_SITE_OTHER): Payer: 59 | Admitting: Nurse Practitioner

## 2020-10-29 ENCOUNTER — Encounter: Payer: Self-pay | Admitting: Nurse Practitioner

## 2020-10-29 VITALS — BP 112/79 | HR 78 | Temp 97.8°F | Resp 20 | Ht 71.0 in | Wt 243.0 lb

## 2020-10-29 DIAGNOSIS — Z0000 Encounter for general adult medical examination without abnormal findings: Secondary | ICD-10-CM | POA: Diagnosis not present

## 2020-10-29 NOTE — Patient Instructions (Signed)

## 2020-10-29 NOTE — Progress Notes (Signed)
   Subjective:    Patient ID: Alyssa Cordova, female    DOB: [birth year], 35 y.o.   MRN: [id number]   Chief Complaint: Annual Exam   HPI Patient ome sin today for annual physical exam she is doing well without complaints. She just recently started on mounjaro for weight loss. She saw clinical pharmacist for this.     Review of Systems  Constitutional:  Negative for diaphoresis.  Eyes:  Negative for pain.  Respiratory:  Negative for shortness of breath.   Cardiovascular:  Negative for chest pain, palpitations and leg swelling.  Gastrointestinal:  Negative for abdominal pain.  Endocrine: Negative for polydipsia.  Skin:  Negative for rash.  Neurological:  Negative for dizziness, weakness and headaches.  Hematological:  Does not bruise/bleed easily.  All other systems reviewed and are negative.     Objective:   Physical Exam Vitals and nursing note reviewed.  Constitutional:      General: She is not in acute distress.    Appearance: Normal appearance. She is well-developed.  HENT:     Head: Normocephalic.     Right Ear: Tympanic membrane normal.     Left Ear: Tympanic membrane normal.     Nose: Nose normal.     Mouth/Throat:     Mouth: Mucous membranes are moist.  Eyes:     Pupils: Pupils are equal, round, and reactive to light.  Neck:     Vascular: No carotid bruit or JVD.  Cardiovascular:     Rate and Rhythm: Normal rate and regular rhythm.     Heart sounds: Normal heart sounds.  Pulmonary:     Effort: Pulmonary effort is normal. No respiratory distress.     Breath sounds: Normal breath sounds. No wheezing or rales.  Chest:     Chest wall: No tenderness.  Abdominal:     General: Bowel sounds are normal. There is no distension or abdominal bruit.     Palpations: Abdomen is soft. There is no hepatomegaly, splenomegaly, mass or pulsatile mass.     Tenderness: There is no abdominal tenderness.  Musculoskeletal:        General: Normal range of motion.     Cervical  back: Normal range of motion and neck supple.  Lymphadenopathy:     Cervical: No cervical adenopathy.  Skin:    General: Skin is warm and dry.  Neurological:     Mental Status: She is alert and oriented to person, place, and time.     Deep Tendon Reflexes: Reflexes are normal and symmetric.  Psychiatric:        Behavior: Behavior normal.        Thought Content: Thought content normal.        Judgment: Judgment normal.    BP 112/79   Pulse 78   Temp 97.8 F (36.6 C) (Temporal)   Resp 20   Ht $R'5\' 11"'pJ$  (1.803 m)   Wt 243 lb (110.2 kg)   SpO2 100%   BMI 33.89 kg/m        Assessment & Plan:  Alyssa Cordova comes in today with chief complaint of Annual Exam   Diagnosis and orders addressed:  1. Annual physical exam Labs pending Exercise encouraged - CBC with Differential/Platelet; Future - CMP14+EGFR; Future - Lipid panel; Future - Thyroid Panel With TSH; Future   Labs pending Health Maintenance reviewed Diet and exercise encouraged  Follow up plan: 1 year and prn   Mary-Margaret Hassell Done, FNP

## 2020-10-30 ENCOUNTER — Encounter: Payer: Self-pay | Admitting: Pharmacist

## 2020-10-30 NOTE — Progress Notes (Signed)
    10/27/2020 Name: Alyssa Cordova MRN: [id number] DOB: Oct 30, 1985   S:  35 yoF Presents for weight loss evaluation, education, and management.  She would like to lose weight and interested in [facility].  She currently weighs 243lbs.  Current BMI is 33.89.  She meets criteria for metabolic syndrome.   Insurance coverage/medication affordability: [employer] employee/[employer]   Patient reports adherence with medications. Current medications for weight loss: n/a   Patient is active during the day. She is a physician and on the go daily.  She appears motivated and ready to lose weight.   Discussed meal planning options and Plate method for healthy eating EAT SMALLER MEALS STOP EATING WHEN YOU FEEL FULL AVOID High FAT, High sugar FOODS INCREASE YOUR WATER INTAKE DECREASE INTAKE OF CARBONATED BEVERAGES TO AVOID GI SIDE EFFECTS   Goal weight is around 180-190lbs per patient report     A/P:   -Healthy eating and meal planning discussed   -Increase water and exercise   -Sample pack given MOUNJARO 2.5MG  (4 week trial) Will call in Mounjaro 5mg  sq weekly for subsequent fill Counseled on contraception to switch to a non-oral contraceptive method, or add a barrier method of contraception for 4 weeks after initiation and for 4 weeks after each dose escalation Denies personal and family history of Medullary thyroid cancer (MTC)             Work to eat low fat, smaller meals to reduce side effects             Decreased carbonated beverages   Written patient instructions provided.  Total time in face to face counseling 25 minutes.   , PharmD, BCPS Clinical Pharmacist, [facility]  II Phone [phone]

## 2020-11-10 ENCOUNTER — Other Ambulatory Visit (HOSPITAL_COMMUNITY): Payer: Self-pay

## 2020-11-10 ENCOUNTER — Telehealth: Payer: Self-pay | Admitting: Pharmacist

## 2020-11-10 MED ORDER — MOUNJARO 2.5 MG/0.5ML ~~LOC~~ SOAJ
2.5000 mg | SUBCUTANEOUS | 2 refills | Status: DC
  Filled 2020-11-10: qty 2, 28d supply, fill #0
  Filled 2020-12-17: qty 2, 28d supply, fill #1
  Filled 2021-01-12: qty 2, 28d supply, fill #2

## 2020-11-10 NOTE — Telephone Encounter (Signed)
Continue mounjaro 2.5mg  refills sent to [facility] outpt

## 2020-11-17 ENCOUNTER — Other Ambulatory Visit (HOSPITAL_COMMUNITY): Payer: Self-pay

## 2020-12-17 ENCOUNTER — Other Ambulatory Visit (HOSPITAL_COMMUNITY): Payer: Self-pay

## 2020-12-21 ENCOUNTER — Other Ambulatory Visit (HOSPITAL_COMMUNITY): Payer: Self-pay

## 2021-01-12 ENCOUNTER — Other Ambulatory Visit (HOSPITAL_COMMUNITY): Payer: Self-pay

## 2021-02-11 ENCOUNTER — Other Ambulatory Visit: Payer: Self-pay | Admitting: Nurse Practitioner

## 2021-02-11 ENCOUNTER — Other Ambulatory Visit (HOSPITAL_COMMUNITY): Payer: Self-pay

## 2021-02-11 MED ORDER — MOUNJARO 2.5 MG/0.5ML ~~LOC~~ SOAJ
2.5000 mg | SUBCUTANEOUS | 2 refills | Status: DC
  Filled 2021-02-11: qty 2, 28d supply, fill #0

## 2021-03-08 ENCOUNTER — Other Ambulatory Visit: Payer: Self-pay | Admitting: Nurse Practitioner

## 2021-03-08 ENCOUNTER — Other Ambulatory Visit (HOSPITAL_COMMUNITY): Payer: Self-pay

## 2021-03-08 MED ORDER — MOUNJARO 5 MG/0.5ML ~~LOC~~ SOAJ
5.0000 mg | SUBCUTANEOUS | 3 refills | Status: DC
  Filled 2021-03-08: qty 2, 28d supply, fill #0
  Filled 2021-04-12: qty 2, 28d supply, fill #1
  Filled 2021-05-04: qty 2, 28d supply, fill #2
  Filled 2021-06-04: qty 2, 28d supply, fill #3

## 2021-04-12 ENCOUNTER — Other Ambulatory Visit (HOSPITAL_COMMUNITY): Payer: Self-pay

## 2021-05-04 ENCOUNTER — Other Ambulatory Visit (HOSPITAL_COMMUNITY): Payer: Self-pay

## 2021-06-04 ENCOUNTER — Other Ambulatory Visit (HOSPITAL_COMMUNITY): Payer: Self-pay

## 2021-06-29 ENCOUNTER — Other Ambulatory Visit (HOSPITAL_COMMUNITY): Payer: Self-pay

## 2021-06-29 ENCOUNTER — Other Ambulatory Visit: Payer: Self-pay | Admitting: Nurse Practitioner

## 2021-06-29 MED ORDER — MOUNJARO 5 MG/0.5ML ~~LOC~~ SOAJ
5.0000 mg | SUBCUTANEOUS | 3 refills | Status: DC
  Filled 2021-06-29: qty 2, 28d supply, fill #0
  Filled 2021-07-26: qty 2, 28d supply, fill #1

## 2021-06-30 ENCOUNTER — Other Ambulatory Visit (HOSPITAL_COMMUNITY): Payer: Self-pay

## 2021-07-07 ENCOUNTER — Other Ambulatory Visit (HOSPITAL_COMMUNITY): Payer: Self-pay

## 2021-07-07 ENCOUNTER — Other Ambulatory Visit: Payer: Self-pay

## 2021-07-07 MED ORDER — LORATADINE 10 MG PO TABS
10.0000 mg | ORAL_TABLET | Freq: Every day | ORAL | 3 refills | Status: AC
  Filled 2021-07-07: qty 90, 90d supply, fill #0
  Filled 2022-02-02: qty 90, 90d supply, fill #1

## 2021-07-26 ENCOUNTER — Other Ambulatory Visit (HOSPITAL_COMMUNITY): Payer: Self-pay

## 2021-08-08 ENCOUNTER — Telehealth: Payer: 59 | Admitting: Physician Assistant

## 2021-08-08 DIAGNOSIS — J020 Streptococcal pharyngitis: Secondary | ICD-10-CM

## 2021-08-08 MED ORDER — AMOXICILLIN 500 MG PO CAPS: 500.0000 mg | ORAL_CAPSULE | Freq: Two times a day (BID) | ORAL | 0 refills | Status: AC

## 2021-08-08 NOTE — Progress Notes (Signed)

## 2021-09-07 ENCOUNTER — Other Ambulatory Visit (HOSPITAL_COMMUNITY): Payer: Self-pay

## 2021-09-07 ENCOUNTER — Other Ambulatory Visit: Payer: Self-pay

## 2021-09-07 MED ORDER — CLOBETASOL PROPIONATE 0.05 % EX OINT
TOPICAL_OINTMENT | CUTANEOUS | 2 refills | Status: AC
  Filled 2021-09-07: qty 60, 30d supply, fill #0
  Filled 2022-02-02: qty 60, 30d supply, fill #1

## 2021-09-08 ENCOUNTER — Other Ambulatory Visit (HOSPITAL_COMMUNITY): Payer: Self-pay

## 2021-10-26 ENCOUNTER — Other Ambulatory Visit: Payer: Self-pay | Admitting: Nurse Practitioner

## 2021-10-26 ENCOUNTER — Telehealth: Payer: Self-pay | Admitting: Nurse Practitioner

## 2021-10-26 DIAGNOSIS — S40261A Insect bite (nonvenomous) of right shoulder, initial encounter: Secondary | ICD-10-CM

## 2021-10-26 MED ORDER — DOXYCYCLINE HYCLATE 100 MG PO TABS: 100.0000 mg | ORAL_TABLET | Freq: Two times a day (BID) | ORAL | 0 refills | Status: DC

## 2021-10-26 NOTE — Telephone Encounter (Signed)
Removed a tick from right shoulder 2 weeks ago. Now has a red ring around it and is itchy.  Probable lyme disease  Meds ordered this encounter  Medications   doxycycline (VIBRA-TABS) 100 MG tablet    Sig: Take 1 tablet (100 mg total) by mouth 2 (two) times daily. 1 po bid    Dispense:  28 tablet    Refill:  0    Order Specific Question:   Supervising Provider    Answer:   Nils Pyle [[id number]]   Mary-Margaret Daphine Deutscher, FNP

## 2021-11-04 ENCOUNTER — Other Ambulatory Visit: Payer: Self-pay | Admitting: Nurse Practitioner

## 2021-11-04 ENCOUNTER — Other Ambulatory Visit (HOSPITAL_COMMUNITY): Payer: Self-pay

## 2021-11-04 ENCOUNTER — Telehealth: Payer: Self-pay | Admitting: Pharmacist

## 2021-11-04 ENCOUNTER — Encounter: Payer: Self-pay | Admitting: Pharmacist

## 2021-11-04 MED ORDER — WEGOVY 0.5 MG/0.5ML ~~LOC~~ SOAJ
0.5000 mg | SUBCUTANEOUS | 2 refills | Status: DC
  Filled 2021-11-04: qty 2, 28d supply, fill #0

## 2021-11-04 NOTE — Telephone Encounter (Signed)
Mazey Marmolejos (Key: [id number]) Wegovy 0.5MG /0.5ML auto-injectors   Form MedImpact ePA Form 2017 NCPDP Created 2 hours ago Sent to Plan 2 hours ago Plan Response 2 hours ago Submit Clinical Questions 2 hours ago Determination Wait for Determination Please wait for MedImpact 2017 to return a determination.

## 2021-11-05 ENCOUNTER — Other Ambulatory Visit (HOSPITAL_COMMUNITY): Payer: Self-pay

## 2021-11-05 NOTE — Telephone Encounter (Signed)
CoverMyMeds has established a business relationship with the Pharmacologist that results in a differentiated user experience on CoverMyMeds for the PA process, and may include patient support services. There may be lower cost medications available or preferred on your patient's plan. Katrece Socorro (Key: [id number]) Wegovy 0.5MG /0.5ML auto-injectors   Form MedImpact ePA Form 2017 NCPDP Created 1 day ago Sent to Plan 1 day ago Plan Response 1 day ago Submit Clinical Questions 1 day ago Determination Favorable 3 hours ago Your prior authorization for Reginal Lutes has been approved! MORE INFO Personalized support and financial assistance may be available through the [employer] program. For more information, and to see program requirements, click on the More Info button to the right.  Message from plan: The request has been approved. The authorization is effective for a maximum of 7 fills from 11/05/2021 to 05/27/2022, as long as the member is enrolled in their current health plan. The request was approved as submitted. The request was approved for 40mL per 28 days.Additional prior authorizations (PA) have been entered OIZ:TIWPYK 0.25mg /0.51mL allowing a maximum of 7 fills with a quantity limit of 48mL per 28 days (PA 6258)Wegovy 1mg /0.77mL allowing a maximum of 7 fills with a quantity limit of 58mL per 28 days (PA 6259)Wegovy 1.7mg /0.77mL allowing a maximum of 7 fills with a quantity limit of 61mL per 28 days (PA 6260)Wegovy 2.4mg /0.65mL allowing a maximum of 7 fills with a quantity limit of 23mL per 28 days (PA 6261)These authorizations are effective 11/05/2021 through 05/27/2022. A written notification letter will follow with additional details.  Patient and pharmacy informed

## 2021-11-09 NOTE — Telephone Encounter (Signed)
Alyssa Cordova (Key: [id number]) Wegovy 0.5MG /0.5ML auto-injectors   Form MedImpact ePA Form 2017 NCPDP Created 5 days ago Sent to Plan 5 days ago Plan Response 5 days ago Submit Clinical Questions 5 days ago Determination Favorable 4 days ago Your prior authorization for Reginal Lutes has been approved! MORE INFO Personalized support and financial assistance may be available through the [employer] program. For more information, and to see program requirements, click on the More Info button to the right.  Message from plan: The request has been approved. The authorization is effective for a maximum of 7 fills from 11/05/2021 to 05/27/2022, as long as the member is enrolled in their current health plan. The request was approved as submitted. The request was approved for 26mL per 28 days.Additional prior authorizations (PA) have been entered GBT:DVVOHY 0.25mg /0.35mL allowing a maximum of 7 fills with a quantity limit of 54mL per 28 days (PA 6258)Wegovy 1mg /0.88mL allowing a maximum of 7 fills with a quantity limit of 2mL per 28 days (PA 6259)Wegovy 1.7mg /0.13mL allowing a maximum of 7 fills with a quantity limit of 68mL per 28 days (PA 6260)Wegovy 2.4mg /0.51mL allowing a maximum of 7 fills with a quantity limit of 67mL per 28 days (PA 6261)These authorizations are effective 11/05/2021 through 05/27/2022. A written notification letter will follow with additional details.

## 2021-11-29 ENCOUNTER — Other Ambulatory Visit: Payer: Self-pay | Admitting: Nurse Practitioner

## 2021-11-29 ENCOUNTER — Other Ambulatory Visit (HOSPITAL_COMMUNITY): Payer: Self-pay

## 2021-11-29 MED ORDER — WEGOVY 1 MG/0.5ML ~~LOC~~ SOAJ
1.0000 mg | SUBCUTANEOUS | 2 refills | Status: DC
  Filled 2021-11-29: qty 2, 28d supply, fill #0
  Filled 2021-12-27: qty 2, 28d supply, fill #1
  Filled 2022-02-02: qty 2, 28d supply, fill #2

## 2021-11-30 ENCOUNTER — Other Ambulatory Visit (HOSPITAL_COMMUNITY): Payer: Self-pay

## 2021-12-02 ENCOUNTER — Encounter: Payer: Self-pay | Admitting: Nurse Practitioner

## 2021-12-02 ENCOUNTER — Ambulatory Visit (INDEPENDENT_AMBULATORY_CARE_PROVIDER_SITE_OTHER): Payer: 59 | Admitting: Nurse Practitioner

## 2021-12-02 VITALS — BP 110/76 | HR 69 | Temp 97.2°F | Resp 20 | Ht 71.0 in | Wt 213.0 lb

## 2021-12-02 DIAGNOSIS — Z0000 Encounter for general adult medical examination without abnormal findings: Secondary | ICD-10-CM | POA: Diagnosis not present

## 2021-12-02 DIAGNOSIS — Z6829 Body mass index (BMI) 29.0-29.9, adult: Secondary | ICD-10-CM | POA: Insufficient documentation

## 2021-12-02 NOTE — Progress Notes (Signed)
Subjective:    Patient ID: Alyssa Cordova, female    DOB: 12-24-1985, 36 y.o.   MRN: [id number]   Chief Complaint: Annual Exam    HPI:  Alyssa Cordova is a 36 y.o. who identifies as a female who was assigned female at birth.   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 BMI 31.0-31.9,adult Wt Readings from Last 3 Encounters:  12/02/21 213 lb (96.6 kg)  11/04/21 243 lb (110.2 kg)  10/29/20 243 lb (110.2 kg)   BMI Readings from Last 3 Encounters:  12/02/21 29.71 kg/m  11/04/21 33.89 kg/m  10/29/20 33.89 kg/m      New complaints: None today  No Known Allergies Outpatient Encounter Medications as of 12/02/2021  Medication Sig   clobetasol ointment (TEMOVATE) 0.05 % Apply to the affected areas 2 times a day   Semaglutide-Weight Management (WEGOVY) 1 MG/0.5ML SOAJ Inject 1 mg into the skin once a week.   loratadine (CLARITIN) 10 MG tablet Take 1 tablet (10 mg total) by mouth daily. (Patient not taking: Reported on 12/02/2021)   [DISCONTINUED] doxycycline (VIBRA-TABS) 100 MG tablet Take 1 tablet (100 mg total) by mouth 2 (two) times daily. 1 po bid   No facility-administered encounter medications on file as of 12/02/2021.    Past Surgical History:  Procedure Laterality Date   EVALUATION UNDER ANESTHESIA WITH ANAL FISTULECTOMY N/A 08/27/2015   Procedure: EXAM UNDER ANESTHESIA WITH ANAL FISTULATOMY;  Surgeon: Jackolyn Confer, MD;  Location: [facility];  Service: General;  Laterality: N/A;   WISDOM TOOTH EXTRACTION  age 18    Family History  Problem Relation Age of Onset   Heart disease Maternal Grandfather    Heart attack Maternal Grandfather    Asthma Mother    Hypertension Mother    Heart disease Mother    Diabetes Mother    Kidney disease Mother    Cancer Maternal Grandmother        colon   Cancer Paternal Grandmother    Heart disease Paternal  Grandfather       Controlled substance contract: n/a     Review of Systems  Constitutional:  Negative for diaphoresis.  Eyes:  Negative for pain.  Respiratory:  Negative for shortness of breath.   Cardiovascular:  Negative for chest pain, palpitations and leg swelling.  Gastrointestinal:  Negative for abdominal pain.  Endocrine: Negative for polydipsia.  Skin:  Negative for rash.  Neurological:  Negative for dizziness, weakness and headaches.  Hematological:  Does not bruise/bleed easily.  All other systems reviewed and are negative.      Objective:   Physical Exam Vitals and nursing note reviewed.  Constitutional:      General: She is not in acute distress.    Appearance: Normal appearance. She is well-developed.  HENT:     Head: Normocephalic.     Right Ear: Tympanic membrane normal.     Left Ear: Tympanic membrane normal.     Nose: Nose normal.     Mouth/Throat:     Mouth: Mucous membranes are moist.  Eyes:     Pupils: Pupils are equal, round, and reactive to light.  Neck:     Vascular: No carotid bruit or JVD.  Cardiovascular:     Rate and Rhythm: Normal rate and regular rhythm.     Heart sounds: Normal heart sounds.  Pulmonary:  Effort: Pulmonary effort is normal. No respiratory distress.     Breath sounds: Normal breath sounds. No wheezing or rales.  Chest:     Chest wall: No tenderness.  Abdominal:     General: Bowel sounds are normal. There is no distension or abdominal bruit.     Palpations: Abdomen is soft. There is no hepatomegaly, splenomegaly, mass or pulsatile mass.     Tenderness: There is no abdominal tenderness.  Musculoskeletal:        General: Normal range of motion.     Cervical back: Normal range of motion and neck supple.  Lymphadenopathy:     Cervical: No cervical adenopathy.  Skin:    General: Skin is warm and dry.  Neurological:     Mental Status: She is alert and oriented to person, place, and time.     Deep Tendon Reflexes:  Reflexes are normal and symmetric.  Psychiatric:        Behavior: Behavior normal.        Thought Content: Thought content normal.        Judgment: Judgment normal.    BP 110/76   Pulse 69   Temp (!) 97.2 F (36.2 C) (Temporal)   Resp 20   Ht 5' 11"  (1.803 m)   Wt 213 lb (96.6 kg)   SpO2 100%   BMI 29.71 kg/m         Assessment & Plan:   Alyssa Cordova comes in today with chief complaint of Annual Exam   Diagnosis and orders addressed:  1. BMI 29.0-29.9,adult Continue diet and exercise  2. Annual physical exam Labs pending - CBC with Differential/Platelet - CMP14+EGFR - Lipid panel - Thyroid Panel With TSH - VITAMIN D 25 Hydroxy (Vit-D Deficiency, Fractures)   Labs pending Health Maintenance reviewed Diet and exercise encouraged  Follow up plan: 1 year   [place], FNP

## 2021-12-02 NOTE — Patient Instructions (Signed)
Exercising to Stay Healthy To become healthy and stay healthy, it is recommended that you do moderate-intensity and vigorous-intensity exercise. You can tell that you are exercising at a moderate intensity if your heart starts beating faster and you start breathing faster but can still hold a conversation. You can tell that you are exercising at a vigorous intensity if you are breathing much harder and faster and cannot hold a conversation while exercising. How can exercise benefit me? Exercising regularly is important. It has many health benefits, such as: Improving overall fitness, flexibility, and endurance. Increasing bone density. Helping with weight control. Decreasing body fat. Increasing muscle strength and endurance. Reducing stress and tension, anxiety, depression, or anger. Improving overall health. What guidelines should I follow while exercising? Before you start a new exercise program, talk with your health care provider. Do not exercise so much that you hurt yourself, feel dizzy, or get very short of breath. Wear comfortable clothes and wear shoes with good support. Drink plenty of water while you exercise to prevent dehydration or heat stroke. Work out until your breathing and your heartbeat get faster (moderate intensity). How often should I exercise? Choose an activity that you enjoy, and set realistic goals. Your health care provider can help you make an activity plan that is individually designed and works best for you. Exercise regularly as told by your health care provider. This may include: Doing strength training two times a week, such as: Lifting weights. Using resistance bands. Push-ups. Sit-ups. Yoga. Doing a certain intensity of exercise for a given amount of time. Choose from these options: A total of 150 minutes of moderate-intensity exercise every week. A total of 75 minutes of vigorous-intensity exercise every week. A mix of moderate-intensity and  vigorous-intensity exercise every week. Children, pregnant women, people who have not exercised regularly, people who are overweight, and older adults may need to talk with a health care provider about what activities are safe to perform. If you have a medical condition, be sure to talk with your health care provider before you start a new exercise program. What are some exercise ideas? Moderate-intensity exercise ideas include: Walking 1 mile (1.6 km) in about 15 minutes. Biking. Hiking. Golfing. Dancing. Water aerobics. Vigorous-intensity exercise ideas include: Walking 4.5 miles (7.2 km) or more in about 1 hour. Jogging or running 5 miles (8 km) in about 1 hour. Biking 10 miles (16.1 km) or more in about 1 hour. Lap swimming. Roller-skating or in-line skating. Cross-country skiing. Vigorous competitive sports, such as football, basketball, and soccer. Jumping rope. Aerobic dancing. What are some everyday activities that can help me get exercise? Yard work, such as: Pushing a lawn mower. Raking and bagging leaves. Washing your car. Pushing a stroller. Shoveling snow. Gardening. Washing windows or floors. How can I be more active in my day-to-day activities? Use stairs instead of an elevator. Take a walk during your lunch break. If you drive, park your car farther away from your work or school. If you take public transportation, get off one stop early and walk the rest of the way. Stand up or walk around during all of your indoor phone calls. Get up, stretch, and walk around every 30 minutes throughout the day. Enjoy exercise with a friend. Support to continue exercising will help you keep a regular routine of activity. Where to find more information You can find more information about exercising to stay healthy from: U.S. Department of Health and Human Services: www.hhs.gov Centers for Disease Control and Prevention (  CDC): www.cdc.gov Summary Exercising regularly is  important. It will improve your overall fitness, flexibility, and endurance. Regular exercise will also improve your overall health. It can help you control your weight, reduce stress, and improve your bone density. Do not exercise so much that you hurt yourself, feel dizzy, or get very short of breath. Before you start a new exercise program, talk with your health care provider. This information is not intended to replace advice given to you by your health care provider. Make sure you discuss any questions you have with your health care provider. Document Revised: 08/14/2020 Document Reviewed: 08/14/2020 Elsevier Patient Education  2023 Elsevier Inc.  

## 2021-12-03 ENCOUNTER — Other Ambulatory Visit: Payer: 59

## 2021-12-03 DIAGNOSIS — Z0000 Encounter for general adult medical examination without abnormal findings: Secondary | ICD-10-CM | POA: Diagnosis not present

## 2021-12-04 ENCOUNTER — Other Ambulatory Visit (HOSPITAL_COMMUNITY): Payer: Self-pay

## 2021-12-04 LAB — CMP14+EGFR
ALT: 17 IU/L (ref 0–32)
AST: 22 IU/L (ref 0–40)
Albumin/Globulin Ratio: 1.6 (ref 1.2–2.2)
Albumin: 4 g/dL (ref 3.9–4.9)
Alkaline Phosphatase: 55 IU/L (ref 44–121)
BUN/Creatinine Ratio: 13 (ref 9–23)
BUN: 11 mg/dL (ref 6–20)
Bilirubin Total: 0.4 mg/dL (ref 0.0–1.2)
CO2: 22 mmol/L (ref 20–29)
Calcium: 9.2 mg/dL (ref 8.7–10.2)
Chloride: 107 mmol/L — ABNORMAL HIGH (ref 96–106)
Creatinine, Ser: 0.86 mg/dL (ref 0.57–1.00)
Globulin, Total: 2.5 g/dL (ref 1.5–4.5)
Glucose: 86 mg/dL (ref 70–99)
Potassium: 4.5 mmol/L (ref 3.5–5.2)
Sodium: 141 mmol/L (ref 134–144)
Total Protein: 6.5 g/dL (ref 6.0–8.5)
eGFR: 90 mL/min/{1.73_m2} (ref >59)

## 2021-12-04 LAB — LIPID PANEL
Chol/HDL Ratio: 3.1 ratio (ref 0.0–4.4)
Cholesterol, Total: 172 mg/dL (ref 100–199)
HDL: 55 mg/dL (ref >39)
LDL Chol Calc (NIH): 105 mg/dL — ABNORMAL HIGH (ref 0–99)
Triglycerides: 64 mg/dL (ref 0–149)
VLDL Cholesterol Cal: 12 mg/dL (ref 5–40)

## 2021-12-04 LAB — CBC WITH DIFFERENTIAL/PLATELET
Basophils Absolute: 0.1 10*3/uL (ref 0.0–0.2)
Basos: 1 %
EOS (ABSOLUTE): 0.2 10*3/uL (ref 0.0–0.4)
Eos: 3 %
Hematocrit: 39.1 % (ref 34.0–46.6)
Hemoglobin: 13 g/dL (ref 11.1–15.9)
Immature Grans (Abs): 0 10*3/uL (ref 0.0–0.1)
Immature Granulocytes: 0 %
Lymphocytes Absolute: 1.4 10*3/uL (ref 0.7–3.1)
Lymphs: 24 %
MCH: 29.8 pg (ref 26.6–33.0)
MCHC: 33.2 g/dL (ref 31.5–35.7)
MCV: 90 fL (ref 79–97)
Monocytes Absolute: 0.4 10*3/uL (ref 0.1–0.9)
Monocytes: 7 %
Neutrophils Absolute: 3.9 10*3/uL (ref 1.4–7.0)
Neutrophils: 65 %
Platelets: 232 10*3/uL (ref 150–450)
RBC: 4.36 x10E6/uL (ref 3.77–5.28)
RDW: 12.7 % (ref 11.7–15.4)
WBC: 6.1 10*3/uL (ref 3.4–10.8)

## 2021-12-04 LAB — THYROID PANEL WITH TSH
Free Thyroxine Index: 1.5 (ref 1.2–4.9)
T3 Uptake Ratio: 23 % — ABNORMAL LOW (ref 24–39)
T4, Total: 6.5 ug/dL (ref 4.5–12.0)
TSH: 1.09 u[IU]/mL (ref 0.450–4.500)

## 2021-12-04 LAB — VITAMIN D 25 HYDROXY (VIT D DEFICIENCY, FRACTURES): Vit D, 25-Hydroxy: 29.6 ng/mL — ABNORMAL LOW (ref 30.0–100.0)

## 2021-12-04 MED ORDER — VITAMIN D (ERGOCALCIFEROL) 1.25 MG (50000 UNIT) PO CAPS
50000.0000 [IU] | ORAL_CAPSULE | ORAL | 0 refills | Status: AC
  Filled 2021-12-04: qty 12, 84d supply, fill #0

## 2021-12-04 NOTE — Addendum Note (Signed)
Addended by: Bennie Pierini on: 12/04/2021 08:48 AM   Modules accepted: Orders

## 2021-12-27 ENCOUNTER — Other Ambulatory Visit (HOSPITAL_COMMUNITY): Payer: Self-pay

## 2022-01-06 ENCOUNTER — Other Ambulatory Visit (HOSPITAL_COMMUNITY): Payer: Self-pay

## 2022-02-02 ENCOUNTER — Other Ambulatory Visit (HOSPITAL_COMMUNITY): Payer: Self-pay

## 2022-02-07 ENCOUNTER — Other Ambulatory Visit (HOSPITAL_COMMUNITY): Payer: Self-pay

## 2022-02-08 ENCOUNTER — Other Ambulatory Visit (HOSPITAL_COMMUNITY): Payer: Self-pay

## 2022-03-11 ENCOUNTER — Other Ambulatory Visit: Payer: Self-pay | Admitting: Nurse Practitioner

## 2022-03-11 ENCOUNTER — Other Ambulatory Visit (HOSPITAL_COMMUNITY): Payer: Self-pay

## 2022-03-11 MED ORDER — WEGOVY 1 MG/0.5ML ~~LOC~~ SOAJ
1.0000 mg | SUBCUTANEOUS | 2 refills | Status: DC
  Filled 2022-03-11: qty 2, 28d supply, fill #0

## 2022-03-17 ENCOUNTER — Other Ambulatory Visit (HOSPITAL_COMMUNITY): Payer: Self-pay

## 2022-03-17 ENCOUNTER — Other Ambulatory Visit: Payer: Self-pay | Admitting: Nurse Practitioner

## 2022-03-17 MED ORDER — WEGOVY 1.7 MG/0.75ML ~~LOC~~ SOAJ
1.7000 mg | SUBCUTANEOUS | 3 refills | Status: DC
  Filled 2022-03-17: qty 3, 28d supply, fill #0
  Filled 2022-04-19 (×2): qty 3, 28d supply, fill #1

## 2022-04-19 ENCOUNTER — Other Ambulatory Visit (HOSPITAL_COMMUNITY): Payer: Self-pay

## 2022-05-05 ENCOUNTER — Other Ambulatory Visit (HOSPITAL_COMMUNITY): Payer: Self-pay

## 2022-05-05 ENCOUNTER — Other Ambulatory Visit: Payer: Self-pay | Admitting: Nurse Practitioner

## 2022-05-05 MED ORDER — WEGOVY 2.4 MG/0.75ML ~~LOC~~ SOAJ
2.4000 mg | SUBCUTANEOUS | 3 refills | Status: DC
  Filled 2022-05-05: qty 3, 28d supply, fill #0

## 2022-05-06 ENCOUNTER — Encounter (HOSPITAL_COMMUNITY): Payer: Self-pay

## 2022-05-06 ENCOUNTER — Other Ambulatory Visit: Payer: Self-pay

## 2022-05-06 ENCOUNTER — Other Ambulatory Visit (HOSPITAL_COMMUNITY): Payer: Self-pay

## 2022-05-09 ENCOUNTER — Other Ambulatory Visit: Payer: Self-pay

## 2022-05-10 ENCOUNTER — Encounter (HOSPITAL_COMMUNITY): Payer: Self-pay

## 2022-05-10 ENCOUNTER — Other Ambulatory Visit (HOSPITAL_COMMUNITY): Payer: Self-pay

## 2022-05-12 ENCOUNTER — Other Ambulatory Visit: Payer: Self-pay

## 2022-05-16 ENCOUNTER — Encounter: Payer: Self-pay | Admitting: Nurse Practitioner

## 2022-05-16 ENCOUNTER — Ambulatory Visit (INDEPENDENT_AMBULATORY_CARE_PROVIDER_SITE_OTHER): Payer: 59 | Admitting: Nurse Practitioner

## 2022-05-16 ENCOUNTER — Other Ambulatory Visit (HOSPITAL_COMMUNITY): Payer: Self-pay

## 2022-05-16 ENCOUNTER — Other Ambulatory Visit: Payer: Self-pay

## 2022-05-16 VITALS — BP 115/81 | HR 100 | Temp 97.8°F | Ht 71.0 in | Wt 202.0 lb

## 2022-05-16 DIAGNOSIS — Z0000 Encounter for general adult medical examination without abnormal findings: Secondary | ICD-10-CM

## 2022-05-16 DIAGNOSIS — R198 Other specified symptoms and signs involving the digestive system and abdomen: Secondary | ICD-10-CM | POA: Diagnosis not present

## 2022-05-16 DIAGNOSIS — Z6828 Body mass index (BMI) 28.0-28.9, adult: Secondary | ICD-10-CM | POA: Diagnosis not present

## 2022-05-16 DIAGNOSIS — Z0001 Encounter for general adult medical examination with abnormal findings: Secondary | ICD-10-CM

## 2022-05-16 MED ORDER — WEGOVY 1 MG/0.5ML ~~LOC~~ SOAJ
1.0000 mg | SUBCUTANEOUS | 3 refills | Status: DC
  Filled 2022-05-16: qty 2, 28d supply, fill #0

## 2022-05-16 NOTE — Progress Notes (Signed)
Subjective:    Patient ID: Alyssa Cordova, female    DOB: 01-Dec-1985, 37 y.o.   MRN: [id number]   Chief Complaint: No chief complaint on file.    HPI:  Alyssa Cordova is a 37 y.o. who identifies as a female who was assigned female at birth.   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 Annual physical exam   2. Alternating constipation and diarrhea Has been having alternation diarrhea and constipation for over a year now. Has occasional gerd with lots of belching- would like to see gi.   3. BMI 28.1-28.9 Has prescripton for wegovy but has not been taking in the last few weeks.  Wt Readings from Last 3 Encounters:  05/16/22 202 lb (91.6 kg)  12/02/21 213 lb (96.6 kg)  11/04/21 243 lb (110.2 kg)   BMI Readings from Last 3 Encounters:  05/16/22 28.17 kg/m  12/02/21 29.71 kg/m  11/04/21 33.89 kg/m     New complaints: As stated above  No Known Allergies Outpatient Encounter Medications as of 05/16/2022  Medication Sig   Semaglutide-Weight Management (WEGOVY) 1 MG/0.5ML SOAJ Inject 1 mg into the skin once a week.   clobetasol ointment (TEMOVATE) 0.05 % Apply to the affected areas 2 times a day   loratadine (CLARITIN) 10 MG tablet Take 1 tablet (10 mg total) by mouth daily. (Patient not taking: Reported on 12/02/2021)   Vitamin D, Ergocalciferol, (DRISDOL) 1.25 MG (50000 UNIT) CAPS capsule Take 1 capsule (50,000 Units total) by mouth every 7 (seven) days.   [DISCONTINUED] Semaglutide-Weight Management (WEGOVY) 2.4 MG/0.75ML SOAJ Inject 2.4 mg into the skin once a week.   No facility-administered encounter medications on file as of 05/16/2022.    Past Surgical History:  Procedure Laterality Date   EVALUATION UNDER ANESTHESIA WITH ANAL FISTULECTOMY N/A 08/27/2015   Procedure: EXAM UNDER ANESTHESIA WITH ANAL FISTULATOMY;  Surgeon: Jackolyn Confer, MD;  [facility];  Service: General;  Laterality: N/A;   WISDOM TOOTH EXTRACTION  age 37    Family History  Problem Relation Age of Onset   Heart disease Maternal Grandfather    Heart attack Maternal Grandfather    Asthma Mother    Hypertension Mother    Heart disease Mother    Diabetes Mother    Kidney disease Mother    Cancer Maternal Grandmother        colon   Cancer Paternal Grandmother    Heart disease Paternal Grandfather       Controlled substance contract: n/a     Review of Systems  Constitutional:  Negative for diaphoresis.  Eyes:  Negative for pain.  Respiratory:  Negative for shortness of breath.   Cardiovascular:  Negative for chest pain, palpitations and leg swelling.  Gastrointestinal:  Positive for constipation and diarrhea. Negative for abdominal pain and nausea.  Endocrine: Negative for polydipsia.  Skin:  Negative for rash.  Neurological:  Negative for dizziness, weakness and headaches.  Hematological:  Does not bruise/bleed easily.  All other systems reviewed and are negative.      Objective:   Physical Exam Vitals and nursing note reviewed.  Constitutional:      General: She is not in acute distress.    Appearance: Normal appearance. She is well-developed.  HENT:     Head: Normocephalic.     Right Ear: Tympanic membrane normal.     Left  Ear: Tympanic membrane normal.     Nose: Nose normal.     Mouth/Throat:     Mouth: Mucous membranes are moist.  Eyes:     Pupils: Pupils are equal, round, and reactive to light.  Neck:     Vascular: No carotid bruit or JVD.  Cardiovascular:     Rate and Rhythm: Normal rate and regular rhythm.     Heart sounds: Normal heart sounds.  Pulmonary:     Effort: Pulmonary effort is normal. No respiratory distress.     Breath sounds: Normal breath sounds. No wheezing or rales.  Chest:     Chest wall: No tenderness.  Abdominal:     General: Bowel sounds are normal. There is no distension or abdominal bruit.      Palpations: Abdomen is soft. There is no hepatomegaly, splenomegaly, mass or pulsatile mass.     Tenderness: There is no abdominal tenderness.  Musculoskeletal:        General: Normal range of motion.     Cervical back: Normal range of motion and neck supple.  Lymphadenopathy:     Cervical: No cervical adenopathy.  Skin:    General: Skin is warm and dry.  Neurological:     Mental Status: She is alert and oriented to person, place, and time.     Deep Tendon Reflexes: Reflexes are normal and symmetric.  Psychiatric:        Behavior: Behavior normal.        Thought Content: Thought content normal.        Judgment: Judgment normal.      BP 115/81   Pulse 100   Temp 97.8 F (36.6 C) (Skin)   Ht 5\' 11"  (1.803 m)   Wt 202 lb (91.6 kg)   BMI 28.17 kg/m       Assessment & Plan:   Alyssa Cordova in today with chief complaint of No chief complaint on file.   1. Annual physical exam - CBC with Differential/Platelet; Future - CMP14+EGFR; Future - Lipid panel; Future - Thyroid Panel With TSH; Future - VITAMIN D 25 Hydroxy (Vit-D Deficiency, Fractures); Future  2. Alternating constipation and diarrhea Watch diet to see if any foods cause flare ups  3. BMI 28.0-28.9,adult Sent in wegovy 1mg  prescription- suggested not to take until sees GI for work up    The above assessment and management plan was discussed with the patient. The patient verbalized understanding of and has agreed to the management plan. Patient is aware to call the clinic if symptoms persist or worsen. Patient is aware when to return to the clinic for a follow-up visit. Patient educated on when it is appropriate to go to the emergency department.   Mary-Margaret Hassell Done, FNP

## 2022-05-17 ENCOUNTER — Other Ambulatory Visit: Payer: Self-pay

## 2022-05-17 ENCOUNTER — Encounter: Payer: Self-pay | Admitting: Pharmacist

## 2022-05-19 ENCOUNTER — Other Ambulatory Visit: Payer: Self-pay

## 2022-05-23 ENCOUNTER — Encounter: Payer: Self-pay | Admitting: Gastroenterology

## 2022-06-10 ENCOUNTER — Other Ambulatory Visit: Payer: 59

## 2022-06-10 DIAGNOSIS — Z0000 Encounter for general adult medical examination without abnormal findings: Secondary | ICD-10-CM

## 2022-06-11 LAB — CBC WITH DIFFERENTIAL/PLATELET
Basophils Absolute: 0.1 10*3/uL (ref 0.0–0.2)
Basos: 1 %
EOS (ABSOLUTE): 0.3 10*3/uL (ref 0.0–0.4)
Eos: 4 %
Hematocrit: 38.6 % (ref 34.0–46.6)
Hemoglobin: 13 g/dL (ref 11.1–15.9)
Immature Grans (Abs): 0 10*3/uL (ref 0.0–0.1)
Immature Granulocytes: 0 %
Lymphocytes Absolute: 1.6 10*3/uL (ref 0.7–3.1)
Lymphs: 25 %
MCH: 29.7 pg (ref 26.6–33.0)
MCHC: 33.7 g/dL (ref 31.5–35.7)
MCV: 88 fL (ref 79–97)
Monocytes Absolute: 0.4 10*3/uL (ref 0.1–0.9)
Monocytes: 7 %
Neutrophils Absolute: 4 10*3/uL (ref 1.4–7.0)
Neutrophils: 63 %
Platelets: 241 10*3/uL (ref 150–450)
RBC: 4.37 x10E6/uL (ref 3.77–5.28)
RDW: 12.5 % (ref 11.7–15.4)
WBC: 6.3 10*3/uL (ref 3.4–10.8)

## 2022-06-11 LAB — THYROID PANEL WITH TSH
Free Thyroxine Index: 1.6 (ref 1.2–4.9)
T3 Uptake Ratio: 24 % (ref 24–39)
T4, Total: 6.6 ug/dL (ref 4.5–12.0)
TSH: 1.3 u[IU]/mL (ref 0.450–4.500)

## 2022-06-11 LAB — VITAMIN D 25 HYDROXY (VIT D DEFICIENCY, FRACTURES): Vit D, 25-Hydroxy: 34 ng/mL (ref 30.0–100.0)

## 2022-06-11 LAB — CMP14+EGFR
ALT: 27 IU/L (ref 0–32)
AST: 23 IU/L (ref 0–40)
Albumin/Globulin Ratio: 1.7 (ref 1.2–2.2)
Albumin: 4.2 g/dL (ref 3.9–4.9)
Alkaline Phosphatase: 61 IU/L (ref 44–121)
BUN/Creatinine Ratio: 13 (ref 9–23)
BUN: 12 mg/dL (ref 6–20)
Bilirubin Total: 0.4 mg/dL (ref 0.0–1.2)
CO2: 23 mmol/L (ref 20–29)
Calcium: 9 mg/dL (ref 8.7–10.2)
Chloride: 104 mmol/L (ref 96–106)
Creatinine, Ser: 0.93 mg/dL (ref 0.57–1.00)
Globulin, Total: 2.5 g/dL (ref 1.5–4.5)
Glucose: 85 mg/dL (ref 70–99)
Potassium: 4.4 mmol/L (ref 3.5–5.2)
Sodium: 140 mmol/L (ref 134–144)
Total Protein: 6.7 g/dL (ref 6.0–8.5)
eGFR: 82 mL/min/{1.73_m2} (ref >59)

## 2022-06-11 LAB — LIPID PANEL
Chol/HDL Ratio: 3 ratio (ref 0.0–4.4)
Cholesterol, Total: 174 mg/dL (ref 100–199)
HDL: 58 mg/dL (ref >39)
LDL Chol Calc (NIH): 103 mg/dL — ABNORMAL HIGH (ref 0–99)
Triglycerides: 69 mg/dL (ref 0–149)
VLDL Cholesterol Cal: 13 mg/dL (ref 5–40)

## 2022-06-15 ENCOUNTER — Ambulatory Visit: Payer: Self-pay | Admitting: Gastroenterology

## 2022-06-29 ENCOUNTER — Other Ambulatory Visit (HOSPITAL_COMMUNITY): Payer: Self-pay

## 2022-06-30 ENCOUNTER — Other Ambulatory Visit (HOSPITAL_COMMUNITY): Payer: Self-pay

## 2022-06-30 ENCOUNTER — Other Ambulatory Visit: Payer: Self-pay | Admitting: Nurse Practitioner

## 2022-06-30 MED ORDER — WEGOVY 1 MG/0.5ML ~~LOC~~ SOAJ
1.0000 mg | SUBCUTANEOUS | 3 refills | Status: DC
  Filled 2022-06-30 – 2022-08-19 (×3): qty 2, 28d supply, fill #0

## 2022-07-15 ENCOUNTER — Other Ambulatory Visit (HOSPITAL_COMMUNITY): Payer: Self-pay

## 2022-08-04 ENCOUNTER — Other Ambulatory Visit: Payer: Self-pay

## 2022-08-05 ENCOUNTER — Other Ambulatory Visit (HOSPITAL_COMMUNITY): Payer: Self-pay

## 2022-08-15 ENCOUNTER — Telehealth: Payer: Self-pay | Admitting: Family Medicine

## 2022-08-15 ENCOUNTER — Other Ambulatory Visit (HOSPITAL_COMMUNITY): Payer: Self-pay

## 2022-08-18 ENCOUNTER — Other Ambulatory Visit (HOSPITAL_COMMUNITY): Payer: Self-pay

## 2022-08-18 ENCOUNTER — Encounter: Payer: Self-pay | Admitting: Pharmacist

## 2022-08-18 ENCOUNTER — Other Ambulatory Visit: Payer: Self-pay

## 2022-08-19 ENCOUNTER — Other Ambulatory Visit: Payer: Self-pay

## 2022-08-19 ENCOUNTER — Other Ambulatory Visit (HOSPITAL_COMMUNITY): Payer: Self-pay

## 2022-08-23 NOTE — Telephone Encounter (Signed)
Prior Alyssa Cordova has been approved for [employer]. This is effective starting 08/17/22-08/16/2023.  The request has been approved for 2ml and 3ml per 28 days.  Pt has been made aware. Letter sent to be scanned.

## 2022-09-30 ENCOUNTER — Encounter: Payer: Self-pay | Admitting: Internal Medicine

## 2022-09-30 ENCOUNTER — Telehealth: Payer: Self-pay | Admitting: Internal Medicine

## 2022-09-30 NOTE — Telephone Encounter (Signed)
error 

## 2023-01-04 ENCOUNTER — Ambulatory Visit (INDEPENDENT_AMBULATORY_CARE_PROVIDER_SITE_OTHER): Payer: 59 | Admitting: Internal Medicine

## 2023-01-04 ENCOUNTER — Encounter: Payer: Self-pay | Admitting: Internal Medicine

## 2023-01-04 VITALS — BP 118/80 | HR 79 | Ht 71.0 in | Wt 213.0 lb

## 2023-01-04 DIAGNOSIS — K625 Hemorrhage of anus and rectum: Secondary | ICD-10-CM | POA: Diagnosis not present

## 2023-01-04 DIAGNOSIS — K5900 Constipation, unspecified: Secondary | ICD-10-CM | POA: Diagnosis not present

## 2023-01-04 DIAGNOSIS — R1031 Right lower quadrant pain: Secondary | ICD-10-CM

## 2023-01-04 DIAGNOSIS — Z800 Family history of malignant neoplasm of digestive organs: Secondary | ICD-10-CM | POA: Diagnosis not present

## 2023-01-04 MED ORDER — NA SULFATE-K SULFATE-MG SULF 17.5-3.13-1.6 GM/177ML PO SOLN: 1.0000 | Freq: Once | ORAL | 0 refills | Status: AC

## 2023-01-04 NOTE — Progress Notes (Signed)
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  The patient reports longstanding issues with chronic constipation for which she takes equate, two daily.  She also describes a several year history of left lower quadrant pain which is at times severe when needing to have a bowel movement.  After having a bowel movement the discomfort is relieved.  This happens a few times per month.  It reminds her of having had a ruptured ovarian cyst in 2021.  Review of CT scan from that time shows inflammatory process in the left pelvis surrounding the left ovary and sigmoid colon.  Next, she has noticed some rectal bleeding on 3 occasions over the past year.  She does have a remote history of rectal fistula repair.  Her grandmother had both a history of inflammatory bowel disease and colon cancer.  The GI review of systems is otherwise negative.  She is no longer on Wegovy  Last seen by her PCP January 2024.  Reviewed.  Blood work from February 2024 also reviewed.  Normal comprehensive metabolic panel, CBC, and thyroid studies.  REVIEW OF SYSTEMS:  All non-GI ROS negative unless otherwise stated in the HPI.  Past Medical History:  Diagnosis Date   Anal fistula    Obesity     Past Surgical History:  Procedure Laterality Date   EVALUATION UNDER ANESTHESIA WITH ANAL FISTULECTOMY N/A 08/27/2015   Procedure: EXAM UNDER ANESTHESIA WITH ANAL FISTULATOMY;  Surgeon: Avel Peace, MD;  Location: [facility];  Service: General;  Laterality: N/A;   WISDOM TOOTH EXTRACTION  age 89    Social History Alyssa Cordova  reports that she has never smoked. She has never used smokeless tobacco. She reports that she does not drink alcohol and does not use drugs.  family history includes Asthma in her mother; Cancer in her maternal grandmother and paternal  grandmother; Diabetes in her mother; Heart attack in her maternal grandfather; Heart disease in her maternal grandfather, mother, and paternal grandfather; Hypertension in her mother; Kidney disease in her mother.  No Known Allergies     PHYSICAL EXAMINATION: Vital signs: BP 118/80   Pulse 79   Ht 5\' 11"  (1.803 m)   Wt 213 lb (96.6 kg)   BMI 29.71 kg/m   Constitutional: generally well-appearing, no acute distress Psychiatric: alert and oriented x3, cooperative Eyes: extraocular movements intact, anicteric, conjunctiva pink Mouth: oral pharynx moist, no lesions Neck: supple no lymphadenopathy Cardiovascular: heart regular rate and rhythm, no murmur Lungs: clear to auscultation bilaterally Abdomen: soft, nontender, nondistended, no obvious ascites, no peritoneal signs, normal bowel sounds, no organomegaly Rectal: Deferred until colonoscopy Extremities: no clubbing, cyanosis, or lower extremity edema bilaterally Skin: no lesions on visible extremities Neuro: No focal deficits.  Cranial nerves intact  ASSESSMENT:  1.  Chronic constipation 2.  Intermittent left lower quadrant pain as described 3.  Episodic rectal bleeding over the past year 4.  History of ruptured left ovarian cyst 5.  Family history of colon cancer and IBD in grandmother  PLAN:  1.  Schedule colonoscopy to evaluate constipation, abdominal pain associated with bowel movements, and rectal bleeding.The nature of the procedure, as well as the risks, benefits, and alternatives were carefully and thoroughly reviewed with the patient. Ample time for discussion and questions allowed. The patient understood, was satisfied, and agreed to proceed. 2.  If colonoscopy unrevealing, then would  proceed with advanced imaging, such as CT scan for further assessment.  A total time of 45 minutes was spent preparing to see the patient, reviewing outside data, obtaining comprehensive history, performing medically appropriate physical  examination, counseling and educating the patient regarding the above listed issues, and ordering/reviewing colonoscopy.  Finally, documenting clinical information in the health record

## 2023-01-04 NOTE — Patient Instructions (Signed)
You have been scheduled for a colonoscopy. Please follow written instructions given to you at your visit today.   Please pick up your prep supplies at the pharmacy within the next 1-3 days.  If you use inhalers (even only as needed), please bring them with you on the day of your procedure.  DO NOT TAKE 7 DAYS PRIOR TO TEST- Trulicity (dulaglutide) Ozempic, Wegovy (semaglutide) Mounjaro (tirzepatide) Bydureon Bcise (exanatide extended release)  DO NOT TAKE 1 DAY PRIOR TO YOUR TEST Rybelsus (semaglutide) Adlyxin (lixisenatide) Victoza (liraglutide) Byetta (exanatide) ___________________________________________________________________________  _______________________________________________________  If your blood pressure at your visit was 140/90 or greater, please contact your primary care physician to follow up on this.  _______________________________________________________  If you are age 56 or older, your body mass index should be between 23-30. Your Body mass index is 29.71 kg/m. If this is out of the aforementioned range listed, please consider follow up with your Primary Care Provider.  If you are age 40 or younger, your body mass index should be between 19-25. Your Body mass index is 29.71 kg/m. If this is out of the aformentioned range listed, please consider follow up with your Primary Care Provider.   ________________________________________________________  The [facility] providers would like to encourage you to use [facility] to communicate with providers for non-urgent requests or questions.  Due to long hold times on the telephone, sending your provider a message by [facility] may be a faster and more efficient way to get a response.  Please allow 48 business hours for a response.  Please remember that this is for non-urgent requests.  _______________________________________________________

## 2023-01-05 ENCOUNTER — Encounter: Payer: Self-pay | Admitting: Internal Medicine

## 2023-01-18 ENCOUNTER — Ambulatory Visit (AMBULATORY_SURGERY_CENTER): Payer: 59 | Admitting: Internal Medicine

## 2023-01-18 ENCOUNTER — Encounter: Payer: Self-pay | Admitting: Internal Medicine

## 2023-01-18 VITALS — BP 110/75 | HR 71 | Temp 98.4°F | Resp 13 | Ht 71.0 in | Wt 213.0 lb

## 2023-01-18 DIAGNOSIS — K5900 Constipation, unspecified: Secondary | ICD-10-CM

## 2023-01-18 DIAGNOSIS — K625 Hemorrhage of anus and rectum: Secondary | ICD-10-CM | POA: Diagnosis not present

## 2023-01-18 DIAGNOSIS — Z800 Family history of malignant neoplasm of digestive organs: Secondary | ICD-10-CM

## 2023-01-18 DIAGNOSIS — R1031 Right lower quadrant pain: Secondary | ICD-10-CM

## 2023-01-18 MED ORDER — SODIUM CHLORIDE 0.9 % IV SOLN: 500.0000 mL | Freq: Once | INTRAVENOUS | Status: DC

## 2023-01-18 NOTE — Patient Instructions (Addendum)
-   Repeat colonoscopy in 10 years for screening purposes. - Resume previous diet. - Continue present medications. -Agent of choice for constipation such as MiraLAX    titrated to needed or daily fiber supplementation    such as Citrucel and 14 ounces of water or juice -PLEASE SCHEDULE CONTRAST ENHANCED CT SCAN OF THE   ABDOMEN AND PELVIS "persistent lower abdominal   pain, evaluate"  YOU HAD AN ENDOSCOPIC PROCEDURE TODAY AT THE [facility]:   Refer to the procedure report that was given to you for any specific questions about what was found during the examination.  If the procedure report does not answer your questions, please call your gastroenterologist to clarify.  If you requested that your care partner not be given the details of your procedure findings, then the procedure report has been included in a sealed envelope for you to review at your convenience later.  YOU SHOULD EXPECT: Some feelings of bloating in the abdomen. Passage of more gas than usual.  Walking can help get rid of the air that was put into your GI tract during the procedure and reduce the bloating. If you had a lower endoscopy (such as a colonoscopy or flexible sigmoidoscopy) you may notice spotting of blood in your stool or on the toilet paper. If you underwent a bowel prep for your procedure, you may not have a normal bowel movement for a few days.  Please Note:  You might notice some irritation and congestion in your nose or some drainage.  This is from the oxygen used during your procedure.  There is no need for concern and it should clear up in a day or so.  SYMPTOMS TO REPORT IMMEDIATELY:  Following lower endoscopy (colonoscopy or flexible sigmoidoscopy):  Excessive amounts of blood in the stool  Significant tenderness or worsening of abdominal pains  Swelling of the abdomen that is new, acute  Fever of 100F or higher   For urgent or emergent issues, a gastroenterologist can be reached at any hour  by calling [phone]. Do not use MyChart messaging for urgent concerns.    DIET:  We do recommend a small meal at first, but then you may proceed to your regular diet.  Drink plenty of fluids but you should avoid alcoholic beverages for 24 hours.  ACTIVITY:  You should plan to take it easy for the rest of today and you should NOT DRIVE or use heavy machinery until tomorrow (because of the sedation medicines used during the test).    FOLLOW UP: Our staff will call the number listed on your records the next business day following your procedure.  We will call around 7:15- 8:00 am to check on you and address any questions or concerns that you may have regarding the information given to you following your procedure. If we do not reach you, we will leave a message.     If any biopsies were taken you will be contacted by phone or by letter within the next 1-3 weeks.  Please call us at [phone] if you have not heard about the biopsies in 3 weeks.    SIGNATURES/CONFIDENTIALITY: You and/or your care partner have signed paperwork which will be entered into your electronic medical record.  These signatures attest to the fact that that the information above on your After Visit Summary has been reviewed and is understood.  Full responsibility of the confidentiality of this discharge information lies with you and/or your care-partner.

## 2023-01-18 NOTE — Op Note (Signed)
[facility] Patient Name: Alyssa Cordova Procedure Date: 01/18/2023 3:39 PM MRN: [id number] Endoscopist: Wilhemina Bonito. Marina Goodell , MD, [id number] Age: 37 Referring MD:  Date of Birth: 02/12/1986 Gender: Female Account #: [id number] Procedure:                Colonoscopy Indications:              Abdominal pain in the right lower quadrant, Rectal                            bleeding, Constipation Medicines:                Monitored Anesthesia Care Procedure:                Pre-Anesthesia Assessment:                           - Prior to the procedure, a History and Physical                            was performed, and patient medications and                            allergies were reviewed. The patient's tolerance of                            previous anesthesia was also reviewed. The risks                            and benefits of the procedure and the sedation                            options and risks were discussed with the patient.                            All questions were answered, and informed consent                            was obtained. Prior Anticoagulants: The patient has                            taken no anticoagulant or antiplatelet agents. ASA                            Grade Assessment: II - A patient with mild systemic                            disease. After reviewing the risks and benefits,                            the patient was deemed in satisfactory condition to                            undergo the procedure.  After obtaining informed consent, the colonoscope                            was passed under direct vision. Throughout the                            procedure, the patient's blood pressure, pulse, and                            oxygen saturations were monitored continuously. The                            Olympus CF-HQ190L ([id number]) Colonoscope was                            introduced through the anus and  advanced to the the                            cecum, identified by appendiceal orifice and                            ileocecal valve. The ileocecal valve, appendiceal                            orifice, and rectum were photographed. It should be                            noted that the appendiceal orifice was clearly                            visualized. Photo image did not capture. The                            quality of the bowel preparation was excellent. The                            colonoscopy was performed without difficulty. The                            patient tolerated the procedure well. The bowel                            preparation used was SUPREP via split dose                            instruction. Scope In: 3:51:01 PM Scope Out: 4:06:23 PM Scope Withdrawal Time: 0 hours 12 minutes 16 seconds  Total Procedure Duration: 0 hours 15 minutes 22 seconds  Findings:                 The terminal ileum appeared normal.                           Non-bleeding internal hemorrhoids were found during  retroflexion. The hemorrhoids were small.                            Hypertrophic anal papilla noted.                           The entire examined colon appeared normal on direct                            and retroflexion views. Complications:            No immediate complications. Estimated blood loss:                            None. Estimated Blood Loss:     Estimated blood loss: none. Impression:               - The examined portion of the ileum was normal.                           - Non-bleeding internal hemorrhoids.                           - The entire examined colon is normal on direct and                            retroflexion views.                           - No specimens collected. Recommendation:           - Repeat colonoscopy in 10 years for screening                            purposes.                           - Patient has a  contact number available for                            emergencies. The signs and symptoms of potential                            delayed complications were discussed with the                            patient. Return to normal activities tomorrow.                            Written discharge instructions were provided to the                            patient.                           - Resume previous diet.                           -  Continue present medications.                           -Agent of choice for constipation such as MiraLAX                            titrated to needed or daily fiber supplementation                            such as Citrucel and 14 ounces of water or juice                           -PLEASE SCHEDULE CONTRAST ENHANCED CT SCAN OF THE                            ABDOMEN AND PELVIS "persistent lower abdominal                            pain, evaluate" Donni Oglesby N. Marina Goodell, MD 01/18/2023 4:14:57 PM This report has been signed electronically.

## 2023-01-18 NOTE — Progress Notes (Unsigned)
Vitals-CW  Pt's states no medical or surgical changes since previsit or office visit. 

## 2023-01-18 NOTE — Progress Notes (Unsigned)
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   The patient reports longstanding issues with chronic constipation for which she takes equate, two daily.  She also describes a several year history of left lower quadrant pain which is at times severe when needing to have a bowel movement.  After having a bowel movement the discomfort is relieved.  This happens a few times per month.  It reminds her of having had a ruptured ovarian cyst in 2021.  Review of CT scan from that time shows inflammatory process in the left pelvis surrounding the left ovary and sigmoid colon.   Next, she has noticed some rectal bleeding on 3 occasions over the past year.  She does have a remote history of rectal fistula repair.  Her grandmother had both a history of inflammatory bowel disease and colon cancer.   The GI review of systems is otherwise negative.  She is no longer on Wegovy   Last seen by her PCP January 2024.  Reviewed.  Blood work from February 2024 also reviewed.  Normal comprehensive metabolic panel, CBC, and thyroid studies.   REVIEW OF SYSTEMS:   All non-GI ROS negative unless otherwise stated in the HPI.       Past Medical History:  Diagnosis Date   Anal fistula     Obesity                 Past Surgical History:  Procedure Laterality Date   EVALUATION UNDER ANESTHESIA WITH ANAL FISTULECTOMY N/A 08/27/2015    Procedure: EXAM UNDER ANESTHESIA WITH ANAL FISTULATOMY;  Surgeon: Avel Peace, MD;  Location: [facility];  Service: General;  Laterality: N/A;   WISDOM TOOTH EXTRACTION   age 10          Social History Alyssa Cordova  reports that she has never smoked. She has never used smokeless tobacco. She reports that she does not drink alcohol and does not use drugs.   family history includes Asthma in  her mother; Cancer in her maternal grandmother and paternal grandmother; Diabetes in her mother; Heart attack in her maternal grandfather; Heart disease in her maternal grandfather, mother, and paternal grandfather; Hypertension in her mother; Kidney disease in her mother.   Allergies  No Known Allergies         PHYSICAL EXAMINATION: Vital signs: BP 118/80   Pulse 79   Ht 5\' 11"  (1.803 m)   Wt 213 lb (96.6 kg)   BMI 29.71 kg/m   Constitutional: generally well-appearing, no acute distress Psychiatric: alert and oriented x3, cooperative Eyes: extraocular movements intact, anicteric, conjunctiva pink Mouth: oral pharynx moist, no lesions Neck: supple no lymphadenopathy Cardiovascular: heart regular rate and rhythm, no murmur Lungs: clear to auscultation bilaterally Abdomen: soft, nontender, nondistended, no obvious ascites, no peritoneal signs, normal bowel sounds, no organomegaly Rectal: Deferred until colonoscopy Extremities: no clubbing, cyanosis, or lower extremity edema bilaterally Skin: no lesions on visible extremities Neuro: No focal deficits.  Cranial nerves intact   ASSESSMENT:   1.  Chronic constipation 2.  Intermittent left lower quadrant pain as described 3.  Episodic rectal bleeding over the past year 4.  History of ruptured left ovarian cyst 5.  Family history of colon cancer and IBD in grandmother   PLAN:   1.  Schedule colonoscopy to evaluate constipation, abdominal pain associated  with bowel movements, and rectal bleeding.The nature of the procedure, as well as the risks, benefits, and alternatives were carefully and thoroughly reviewed with the patient. Ample time for discussion and questions allowed. The patient understood, was satisfied, and agreed to proceed. 2.  If colonoscopy unrevealing, then would proceed with advanced imaging, such as CT scan for further assessment.

## 2023-01-18 NOTE — Progress Notes (Unsigned)
Vss nad trans to pacu 

## 2023-01-19 ENCOUNTER — Telehealth: Payer: Self-pay

## 2023-01-19 NOTE — Telephone Encounter (Signed)
  Follow up Call-     01/18/2023    2:46 PM 01/18/2023    2:40 PM  Call back number  Post procedure Call Back phone  # [phone]   Permission to leave phone message  Yes     Patient questions:  Do you have a fever, pain , or abdominal swelling? No. Pain Score  0 *  Have you tolerated food without any problems? Yes.    Have you been able to return to your normal activities? Yes.    Do you have any questions about your discharge instructions: Diet   No. Medications  No. Follow up visit  No.  Do you have questions or concerns about your Care? No.  Actions: * If pain score is 4 or above: No action needed, pain <4.

## 2023-02-01 DIAGNOSIS — Z01419 Encounter for gynecological examination (general) (routine) without abnormal findings: Secondary | ICD-10-CM | POA: Diagnosis not present

## 2023-02-01 DIAGNOSIS — Z6830 Body mass index (BMI) 30.0-30.9, adult: Secondary | ICD-10-CM | POA: Diagnosis not present

## 2023-02-01 DIAGNOSIS — Z124 Encounter for screening for malignant neoplasm of cervix: Secondary | ICD-10-CM | POA: Diagnosis not present

## 2023-02-01 DIAGNOSIS — Z1151 Encounter for screening for human papillomavirus (HPV): Secondary | ICD-10-CM | POA: Diagnosis not present

## 2023-05-12 ENCOUNTER — Other Ambulatory Visit: Payer: Self-pay | Admitting: Nurse Practitioner

## 2023-05-12 MED ORDER — CEPHALEXIN 500 MG PO CAPS: 500.0000 mg | ORAL_CAPSULE | Freq: Two times a day (BID) | ORAL | 0 refills | Status: AC

## 2023-12-08 ENCOUNTER — Ambulatory Visit (INDEPENDENT_AMBULATORY_CARE_PROVIDER_SITE_OTHER): Admitting: Nurse Practitioner

## 2023-12-08 ENCOUNTER — Encounter: Payer: Self-pay | Admitting: Nurse Practitioner

## 2023-12-08 VITALS — BP 120/76 | HR 76 | Ht 71.0 in | Wt 199.0 lb

## 2023-12-08 DIAGNOSIS — Z0000 Encounter for general adult medical examination without abnormal findings: Secondary | ICD-10-CM | POA: Diagnosis not present

## 2023-12-08 NOTE — Progress Notes (Signed)
 Subjective:    Patient ID: Alyssa Cordova, female    DOB: 05-Aug-1985, 38 y.o.   MRN: [id number]   Chief Complaint: No chief complaint on file.    HPI:  Alyssa Cordova is a 38 y.o. who identifies as a female who was assigned female at birth.   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 She has no chronic medical issue and is on no meds.   There are no diagnoses linked to this encounter.  New complaints: None today  No Known Allergies Outpatient Encounter Medications as of 12/08/2023  Medication Sig   cephALEXin  (KEFLEX ) 500 MG capsule Take 1 capsule (500 mg total) by mouth 2 (two) times daily.   clobetasol  ointment (TEMOVATE ) 0.05 % Apply to the affected areas 2 times a day   loratadine  (CLARITIN ) 10 MG tablet Take 1 tablet (10 mg total) by mouth daily.   OVER THE COUNTER MEDICATION Pt take  2 laxative pills a day   Vitamin D , Ergocalciferol , (DRISDOL ) 1.25 MG (50000 UNIT) CAPS capsule Take 1 capsule (50,000 Units total) by mouth every 7 (seven) days. (Patient not taking: Reported on 01/04/2023)   No facility-administered encounter medications on file as of 12/08/2023.    Past Surgical History:  Procedure Laterality Date   EVALUATION UNDER ANESTHESIA WITH ANAL FISTULECTOMY N/A 08/27/2015   Procedure: EXAM UNDER ANESTHESIA WITH ANAL FISTULATOMY;  Surgeon: Krystal Russell, MD;  Location: [facility];  Service: General;  Laterality: N/A;   WISDOM TOOTH EXTRACTION  age 56    Family History  Problem Relation Age of Onset   Heart disease Maternal Grandfather    Heart attack Maternal Grandfather    Asthma Mother    Hypertension Mother    Heart disease Mother    Diabetes Mother    Kidney disease Mother    Cancer Maternal Grandmother        colon   Cancer Paternal Grandmother    Heart disease Paternal Grandfather       Controlled substance contract: n/a     Review of Systems   Constitutional:  Negative for diaphoresis.  Eyes:  Negative for pain.  Respiratory:  Negative for shortness of breath.   Cardiovascular:  Negative for chest pain, palpitations and leg swelling.  Gastrointestinal:  Negative for abdominal pain.  Endocrine: Negative for polydipsia.  Skin:  Negative for rash.  Neurological:  Negative for dizziness, weakness and headaches.  Hematological:  Does not bruise/bleed easily.  All other systems reviewed and are negative.      Objective:   Physical Exam Vitals and nursing note reviewed.  Constitutional:      General: She is not in acute distress.    Appearance: Normal appearance. She is well-developed.  HENT:     Head: Normocephalic.     Right Ear: Tympanic membrane normal.     Left Ear: Tympanic membrane normal.     Nose: Nose normal.     Mouth/Throat:     Mouth: Mucous membranes are moist.  Eyes:     Pupils: Pupils are equal, round, and reactive to light.  Neck:     Vascular: No carotid bruit or JVD.  Cardiovascular:     Rate and Rhythm: Normal rate and regular rhythm.     Heart sounds: Normal heart sounds.  Pulmonary:     Effort: Pulmonary effort is normal. No respiratory distress.     Breath sounds: Normal breath  sounds. No wheezing or rales.  Chest:     Chest wall: No tenderness.  Abdominal:     General: Bowel sounds are normal. There is no distension or abdominal bruit.     Palpations: Abdomen is soft. There is no hepatomegaly, splenomegaly, mass or pulsatile mass.     Tenderness: There is no abdominal tenderness.  Musculoskeletal:        General: Normal range of motion.     Cervical back: Normal range of motion and neck supple.  Lymphadenopathy:     Cervical: No cervical adenopathy.  Skin:    General: Skin is warm and dry.  Neurological:     Mental Status: She is alert and oriented to person, place, and time.     Deep Tendon Reflexes: Reflexes are normal and symmetric.  Psychiatric:        Behavior: Behavior normal.         Thought Content: Thought content normal.        Judgment: Judgment normal.    BP 120/76   Pulse 76   Ht 5' 11 (1.803 m)   Wt 199 lb (90.3 kg)   BMI 27.75 kg/m           Assessment & Plan:   Alyssa Cordova comes in today with chief complaint of No chief complaint on file.   Diagnosis and orders addressed:  1. Annual physical exam (Primary) - CBC with Differential/Platelet - CMP14+EGFR - Lipid panel - Thyroid  Panel With TSH - VITAMIN D  25 Hydroxy (Vit-D Deficiency, Fractures)   Labs pending Health Maintenance reviewed Diet and exercise encouraged  Follow up plan: 1 year   Mary-Margaret Gladis, FNP

## 2023-12-08 NOTE — Addendum Note (Signed)
 Addended by: Yishai Rehfeld, MARY-MARGARET on: 12/08/2023 03:25 PM   Modules accepted: Orders

## 2023-12-22 ENCOUNTER — Other Ambulatory Visit

## 2023-12-22 DIAGNOSIS — Z0000 Encounter for general adult medical examination without abnormal findings: Secondary | ICD-10-CM

## 2023-12-22 LAB — LIPID PANEL

## 2023-12-23 LAB — CMP14+EGFR
ALT: 25 IU/L (ref 0–32)
AST: 27 IU/L (ref 0–40)
Albumin: 4.3 g/dL (ref 3.9–4.9)
Alkaline Phosphatase: 63 IU/L (ref 44–121)
BUN/Creatinine Ratio: 14 (ref 9–23)
BUN: 12 mg/dL (ref 6–20)
Bilirubin Total: 0.5 mg/dL (ref 0.0–1.2)
CO2: 18 mmol/L — ABNORMAL LOW (ref 20–29)
Calcium: 8.9 mg/dL (ref 8.7–10.2)
Chloride: 104 mmol/L (ref 96–106)
Creatinine, Ser: 0.86 mg/dL (ref 0.57–1.00)
Globulin, Total: 2.9 g/dL (ref 1.5–4.5)
Glucose: 82 mg/dL (ref 70–99)
Potassium: 4.7 mmol/L (ref 3.5–5.2)
Sodium: 136 mmol/L (ref 134–144)
Total Protein: 7.2 g/dL (ref 6.0–8.5)
eGFR: 89 mL/min/1.73 (ref >59)

## 2023-12-23 LAB — CBC WITH DIFFERENTIAL/PLATELET
Basophils Absolute: 0.1 x10E3/uL (ref 0.0–0.2)
Basos: 1 %
EOS (ABSOLUTE): 0.2 x10E3/uL (ref 0.0–0.4)
Eos: 2 %
Hematocrit: 43 % (ref 34.0–46.6)
Hemoglobin: 13.8 g/dL (ref 11.1–15.9)
Immature Grans (Abs): 0 x10E3/uL (ref 0.0–0.1)
Immature Granulocytes: 0 %
Lymphocytes Absolute: 1.5 x10E3/uL (ref 0.7–3.1)
Lymphs: 20 %
MCH: 29.3 pg (ref 26.6–33.0)
MCHC: 32.1 g/dL (ref 31.5–35.7)
MCV: 91 fL (ref 79–97)
Monocytes Absolute: 0.6 x10E3/uL (ref 0.1–0.9)
Monocytes: 7 %
Neutrophils Absolute: 5.3 x10E3/uL (ref 1.4–7.0)
Neutrophils: 70 %
Platelets: 234 x10E3/uL (ref 150–450)
RBC: 4.71 x10E6/uL (ref 3.77–5.28)
RDW: 12.9 % (ref 11.7–15.4)
WBC: 7.6 x10E3/uL (ref 3.4–10.8)

## 2023-12-23 LAB — VITAMIN D 25 HYDROXY (VIT D DEFICIENCY, FRACTURES): Vit D, 25-Hydroxy: 25.6 ng/mL — ABNORMAL LOW (ref 30.0–100.0)

## 2023-12-23 LAB — LIPID PANEL
Chol/HDL Ratio: 3.2 ratio (ref 0.0–4.4)
Cholesterol, Total: 180 mg/dL (ref 100–199)
HDL: 56 mg/dL (ref >39)
LDL Chol Calc (NIH): 110 mg/dL — ABNORMAL HIGH (ref 0–99)
Triglycerides: 77 mg/dL (ref 0–149)
VLDL Cholesterol Cal: 14 mg/dL (ref 5–40)

## 2023-12-23 LAB — THYROID PANEL WITH TSH
Free Thyroxine Index: 1.7 (ref 1.2–4.9)
T3 Uptake Ratio: 24 % (ref 24–39)
T4, Total: 7.2 ug/dL (ref 4.5–12.0)
TSH: 0.882 u[IU]/mL (ref 0.450–4.500)

## 2023-12-25 ENCOUNTER — Ambulatory Visit: Payer: Self-pay | Admitting: Nurse Practitioner

## 2024-03-20 DIAGNOSIS — N979 Female infertility, unspecified: Secondary | ICD-10-CM | POA: Diagnosis not present

## 2024-03-20 DIAGNOSIS — L900 Lichen sclerosus et atrophicus: Secondary | ICD-10-CM | POA: Diagnosis not present

## 2024-03-20 DIAGNOSIS — Z6828 Body mass index (BMI) 28.0-28.9, adult: Secondary | ICD-10-CM | POA: Diagnosis not present

## 2024-03-20 DIAGNOSIS — Z01419 Encounter for gynecological examination (general) (routine) without abnormal findings: Secondary | ICD-10-CM | POA: Diagnosis not present

## 2024-03-21 ENCOUNTER — Other Ambulatory Visit: Payer: Self-pay

## 2024-03-21 ENCOUNTER — Other Ambulatory Visit (HOSPITAL_COMMUNITY): Payer: Self-pay

## 2024-03-21 MED ORDER — CLOBETASOL PROPIONATE 0.05 % EX OINT
TOPICAL_OINTMENT | Freq: Two times a day (BID) | CUTANEOUS | 1 refills | Status: AC
Start: 2024-03-20 — End: ?
  Filled 2024-03-21: qty 30, 20d supply, fill #0
